# Patient Record
Sex: Female | Born: 1980 | Race: White | Hispanic: No | Smoking: Never smoker
Health system: Southern US, Community
[De-identification: ages and names within clinical notes are randomized; demographics above are authoritative.]

## PROBLEM LIST (undated history)

## (undated) DIAGNOSIS — F609 Personality disorder, unspecified: Secondary | ICD-10-CM

## (undated) DIAGNOSIS — F319 Bipolar disorder, unspecified: Secondary | ICD-10-CM

## (undated) DIAGNOSIS — F419 Anxiety disorder, unspecified: Secondary | ICD-10-CM

## (undated) HISTORY — DX: Bipolar disorder, unspecified: F31.9

## (undated) HISTORY — DX: Anxiety disorder, unspecified: F41.9

## (undated) HISTORY — DX: Personality disorder, unspecified: F60.9

## (undated) HISTORY — PX: COLPOSCOPY W/ BIOPSY / CURETTAGE: SUR283

---

## 2002-08-11 ENCOUNTER — Emergency Department (HOSPITAL_COMMUNITY): Admission: EM | Admit: 2002-08-11 | Discharge: 2002-08-11 | Payer: Self-pay | Admitting: Emergency Medicine

## 2010-10-11 ENCOUNTER — Inpatient Hospital Stay (HOSPITAL_COMMUNITY): Admission: AD | Admit: 2010-10-11 | Payer: Self-pay | Admitting: Obstetrics and Gynecology

## 2011-01-31 ENCOUNTER — Inpatient Hospital Stay (HOSPITAL_COMMUNITY): Admission: AD | Admit: 2011-01-31 | Payer: Self-pay | Source: Ambulatory Visit | Admitting: Obstetrics

## 2011-02-10 ENCOUNTER — Inpatient Hospital Stay (HOSPITAL_COMMUNITY)
Admission: RE | Admit: 2011-02-10 | Discharge: 2011-02-13 | DRG: 765 | Disposition: A | Discharge status: Home / Self Care | Payer: 59 | Source: Ambulatory Visit | Attending: Obstetrics & Gynecology | Admitting: Obstetrics & Gynecology

## 2011-02-10 DIAGNOSIS — O9903 Anemia complicating the puerperium: Secondary | ICD-10-CM | POA: Diagnosis not present

## 2011-02-10 DIAGNOSIS — O480 Post-term pregnancy: Principal | ICD-10-CM | POA: Diagnosis present

## 2011-02-10 DIAGNOSIS — D62 Acute posthemorrhagic anemia: Secondary | ICD-10-CM | POA: Diagnosis not present

## 2011-02-10 LAB — RPR: RPR Ser Ql: NONREACTIVE

## 2011-02-10 LAB — CBC
HCT: 34.5 % — ABNORMAL LOW (ref 36.0–46.0)
Hemoglobin: 11.7 g/dL — ABNORMAL LOW (ref 12.0–15.0)
MCH: 30.4 pg (ref 26.0–34.0)
MCHC: 33.9 g/dL (ref 30.0–36.0)
MCV: 89.6 fL (ref 78.0–100.0)
Platelets: 158 10*3/uL (ref 150–400)
RBC: 3.85 MIL/uL — ABNORMAL LOW (ref 3.87–5.11)
RDW: 12.9 % (ref 11.5–15.5)
WBC: 9.2 10*3/uL (ref 4.0–10.5)

## 2011-02-11 ENCOUNTER — Other Ambulatory Visit: Payer: Self-pay | Admitting: Obstetrics & Gynecology

## 2011-02-12 LAB — CBC
HCT: 25.6 % — ABNORMAL LOW (ref 36.0–46.0)
Hemoglobin: 8.7 g/dL — ABNORMAL LOW (ref 12.0–15.0)
MCH: 30.4 pg (ref 26.0–34.0)
MCHC: 34 g/dL (ref 30.0–36.0)
MCV: 89.5 fL (ref 78.0–100.0)
Platelets: 126 10*3/uL — ABNORMAL LOW (ref 150–400)
RBC: 2.86 MIL/uL — ABNORMAL LOW (ref 3.87–5.11)
RDW: 12.9 % (ref 11.5–15.5)
WBC: 10.4 10*3/uL (ref 4.0–10.5)

## 2011-03-11 NOTE — Discharge Summary (Addendum)
Becky Mata, SIGMAN              ACCOUNT NO.:  [id number]  MEDICAL RECORD NO.:  [id number]           PATIENT TYPE:  I  LOCATION:  9129                          FACILITY:  [facility]  PHYSICIAN:  Genia Del, M.D.DATE OF BIRTH:  Oct 12, 1981  DATE OF ADMISSION:  02/10/2011 DATE OF DISCHARGE:  02/13/2011                              DISCHARGE SUMMARY   ADMISSION DIAGNOSES: 1. Intrauterine pregnancy at 41 weeks and 3 days' gestation with a     favorable Bishop. 2. Admission for induction of labor with low-dose Pitocin.  DISCHARGE DIAGNOSES: 1. Status post cesarean section due to fetal intolerance to labor and     arrest of descent. 2. Delivery of viable female infant. 3. Acute blood loss anemia.  The patient is a G1, P0 at 41 weeks and 3 days' gestation with an [facility] of January 31, 2011.  The patient has received prenatal care at [facility] since 7 weeks' gestation with Marlinda Mike, certified nurse midwife, as primary provider.  PRENATAL LABS:  Rh positive, rubella immune, GBS negative, HIV nonreactive, RPR nonreactive, hepatitis B nonreactive, and GTT within normal limits at 99.  Prenatal course has been uncomplicated.  ALLERGIES:  No known allergies.  CURRENT MEDICATIONS:  Prenatal vitamin 1 tab p.o. daily.  The patient was admitted on Feb 10, 2011.  Admission labs include CBC; WBC 9.2, hemoglobin 11.7, hematocrit 34.5, and platelets 158.  Low-dose Pitocin was started, and the patient did progress in labor with cervical dilatation to 7-8, completely effaced, and +1 station with caput.  Due to arrest of dilatation, decision was made to proceed with C-section.  C- section was performed on Feb 11, 2011, by Dr. Genia Del.  A viable female infant was delivered, weighing 8 pounds and 9 ounces, Apgars were 8 and 9 at 1 and 5 minutes.  The patient was transferred to [facility] in stable condition.  Postoperatively, the patient has progressed well.  Labs on Feb 12, 2011 include CBC; WBC 10.4, hemoglobin 8.7, hematocrit 25.6, and platelets 126.  The patient has progressed well postpartum.  Vital signs were stable.  The patient is tolerating a regular diet and is up ad lib., voiding without problems.  Abdominal incision is low and transverse, closed with staples.  Staples were clean, dry, and intact, and incision is well approximated.  Female infant is successfully breast-feeding.  The patient is to return to the office on day #6, Feb 16, 2011, at 10 a.m. for staple removal.  She has been given the [facility] postpartum booklet.  MEDICATIONS AT DISCHARGE: 1. Ibuprofen 600 mg p.o. q.6 hours p.r.n. pain, dispensed #30, no     refill. 2. Vicodin 1-2 p.o. q.6 hours p.r.n. pain, dispensed #30, no refill. 3. Ferralet 90 one p.o. daily, dispensed #30, refill x1. 4. Colace 100 mg 1-2 p.o. b.i.d. p.r.n. constipation, dispensed #30,     no refill.  The patient has been advised of appointment to return for staple removal and to return for routine postpartum visit in 6 weeks.     Arlana Lindau, NP   ______________________________ Genia Del, M.D.    JF/MEDQ  D:  02/13/2011  T:  02/13/2011  Job:  [id number]  Electronically Signed by Genia Del M.D. on 03/11/2011 05:03:44 PM Electronically Signed by Arlana Lindau  on 03/11/2011 05:04:09 PM

## 2011-03-11 NOTE — Op Note (Signed)
Becky Mata, Becky Mata              ACCOUNT NO.:  [id number]  MEDICAL RECORD NO.:  [id number]           PATIENT TYPE:  LOCATION:                                 FACILITY:  PHYSICIAN:  Genia Del, M.D.DATE OF BIRTH:  1981/02/15  DATE OF PROCEDURE: DATE OF DISCHARGE:                              OPERATIVE REPORT   PREOPERATIVE DIAGNOSIS:  A 41 weeks' gestation, arrest of progression, fetal intolerance to active labor.  POSTOPERATIVE DIAGNOSIS:  A 41 weeks' gestation, arrest of progression, fetal intolerance to active labor.  PROCEDURE:  Urgent primary low transverse cesarean section.  SURGEON:  Genia Del, MD  ASSISTANT:  Becky Organ, MD  ANESTHESIOLOGIST:  Becky Pou, MD  PROCEDURE:  Under epidural, the patient is in 15-degree left decubitus position.  She was prepped with Betadine on the abdominal, suprapubic, vulvar, and vaginal areas and draped as usual.  The Foley is in place in the bladder.  We verified the level of anesthesia, it is adequate.  We infiltrated the subcutaneous tissue with Marcaine 0.25% plain 10 mL.  We made a Pfannenstiel incision with a scalpel, opened the adipose tissue with the scalpel and then the electrocautery.  We opened the aponeurosis transversely with the electrocautery and finished on each side with Mayo scissors.  The recti muscles are separated from the aponeurosis on the midline.  The parietal peritoneum is opened longitudinally.  We put the bladder retractor in place, opened the visceral peritoneum transversely with Metz scissors, reclined the bladder downward, repositioned the bladder retractor.  We made a low transverse hysterotomy with a scalpel. We extended on each side with dressing scissors.  The amniotic fluid is clear.  The fetus is in cephalic presentation.  Birth of a baby girl at 4:11 a.m.  The cord is clamped and cut.  There were two loose nuchal cords.  Apgars are 8 and 9.  The weight is 8 pounds 9  ounces.  We do cord blood donor.  We then removed the placenta.  Pitocin is started in the IV fluids.  The uterus contracted well, uterine revision was done. Both ovaries and both tubes were normal to inspection.  We closed the hysterotomy with a first locked running suture of Vicryl 0, a second plane with a mattress suture is done with Vicryl 0.  Hemostasis was adequate on the hysterotomy.  We then irrigated and suctioned the abdominopelvic cavities.  We completed hemostasis where necessary with the electrocautery on the recti muscles.  We then closed the aponeurosis with two half running sutures of Vicryl 0.  We completed hemostasis on the adipose tissue with the electrocautery.  The separate stitches of plain were done on the adipose tissue, and we reapproximated the skin with staples.  A dry dressing was applied.  The count of instruments and sponges was complete.  The estimated blood loss was 1200 mL.  The ins were 2000 mL, outs 200 mL.  No complications occurred, and the patient was brought to recovery room in good stable status.   Genia Del, M.D.     ML/MEDQ  D:  02/11/2011  T:  02/11/2011  Job:  [id number]  Electronically Signed by Genia Del M.D. on 03/11/2011 05:03:46 PM

## 2011-06-16 ENCOUNTER — Encounter: Payer: Self-pay | Admitting: Internal Medicine

## 2011-06-16 ENCOUNTER — Ambulatory Visit (INDEPENDENT_AMBULATORY_CARE_PROVIDER_SITE_OTHER): Payer: 59 | Admitting: Internal Medicine

## 2011-06-16 DIAGNOSIS — R5383 Other fatigue: Secondary | ICD-10-CM

## 2011-06-16 DIAGNOSIS — M25559 Pain in unspecified hip: Secondary | ICD-10-CM

## 2011-06-16 DIAGNOSIS — R5381 Other malaise: Secondary | ICD-10-CM | POA: Insufficient documentation

## 2011-06-16 NOTE — Assessment & Plan Note (Signed)
Non specific Will check labs Concerning in view of the ongoing joint pains and stiffness

## 2011-06-16 NOTE — Patient Instructions (Signed)
Please set up blood work and bilateral hip x-rays at your convenience CBC with diff, met b, hepatic, TSH, rheumatoid factor, ANA  &sed rate (780.79 and arthralgias)

## 2011-06-16 NOTE — Assessment & Plan Note (Signed)
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

## 2011-06-16 NOTE — Progress Notes (Signed)
  Subjective:    Patient ID: Becky Mata, female    DOB: 1980-11-13, 30 y.o.   MRN: [id number]  HPI Here to establish Having ongoing joint problems Mom just diagnosed with rheumatoid arthritis and is fairly disabled, so she is concerned  Has bilateral hip pain that goes back 5 years Naproxen, tylenol is no help Ibuprofen 800mg  bid will help Has used mom's tramadol 100mg  qAM and this really does help May be worse at times---notes weakness after sex. 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    No Known Allergies  No past medical history on file.  Past Surgical History  Procedure Date  . Cesarean section 2012  . Colposcopy w/ biopsy / curettage ~2009    Family History  Problem Relation Age of Onset  . Arthritis Mother     rheumatoid arthritis  . Alcohol abuse Mother   . Hypertension Father   . Diabetes Maternal Grandmother   . Fibromyalgia Maternal Grandmother   . Heart disease Neg Hx     History   Social History  . Marital Status: Married    Spouse Name: N/A    Number of Children: 1  . Years of Education: N/A   Occupational History  . PT aide [employer]   Social History Main Topics  . Smoking status: Never Smoker   . Smokeless tobacco: Never Used  . Alcohol Use: No  . Drug Use: No  . Sexually Active: Not on file   Other Topics Concern  . Not on file   Social History Narrative  . No narrative on file   Review of Systems No swallowing problems No rashes  No photosensitivity No fevers No problems with weight---back almost to her prepregnancy  weight (has 62 month old) No ulcers    Objective:   Physical Exam  Constitutional: She appears well-developed and well-nourished. No distress.  HENT:  Mouth/Throat: Oropharynx is clear and moist. No oropharyngeal exudate.       No oral lesions  Eyes: Conjunctivae are normal. Pupils are equal, round, and reactive to light.  Neck: Normal range of motion. Neck supple. No thyromegaly present.  Cardiovascular: Normal rate, regular rhythm and normal heart sounds.  Exam reveals no gallop.   No murmur heard. Pulmonary/Chest: Effort normal and breath sounds normal. No respiratory distress. She has no wheezes. She has no rales.  Abdominal: Soft. She exhibits no mass. There is no tenderness.  Musculoskeletal: Normal range of motion. She exhibits no edema and no tenderness.       No active synovitis in any joints Normal ROM in all joints No tenderness in trigger points  Lymphadenopathy:    She has no cervical adenopathy.  Psychiatric: She has a normal mood and affect. Her behavior is normal. Judgment and thought content normal.          Assessment & Plan:

## 2011-06-23 ENCOUNTER — Other Ambulatory Visit (INDEPENDENT_AMBULATORY_CARE_PROVIDER_SITE_OTHER): Payer: 59

## 2011-06-23 ENCOUNTER — Other Ambulatory Visit: Payer: Self-pay | Admitting: *Deleted

## 2011-06-23 ENCOUNTER — Ambulatory Visit (INDEPENDENT_AMBULATORY_CARE_PROVIDER_SITE_OTHER)
Admission: RE | Admit: 2011-06-23 | Discharge: 2011-06-23 | Disposition: A | Discharge status: Home / Self Care | Payer: 59 | Source: Ambulatory Visit | Attending: Internal Medicine | Admitting: Internal Medicine

## 2011-06-23 DIAGNOSIS — M25559 Pain in unspecified hip: Secondary | ICD-10-CM

## 2011-06-23 DIAGNOSIS — R5381 Other malaise: Secondary | ICD-10-CM

## 2011-06-23 DIAGNOSIS — M25551 Pain in right hip: Secondary | ICD-10-CM

## 2011-06-23 DIAGNOSIS — M2550 Pain in unspecified joint: Secondary | ICD-10-CM

## 2011-06-23 DIAGNOSIS — R5383 Other fatigue: Secondary | ICD-10-CM

## 2011-06-23 LAB — CBC WITH DIFFERENTIAL/PLATELET
Basophils Absolute: 0 10*3/uL (ref 0.0–0.1)
Eosinophils Relative: 5.3 % — ABNORMAL HIGH (ref 0.0–5.0)
Monocytes Absolute: 0.4 10*3/uL (ref 0.1–1.0)
Monocytes Relative: 6.2 % (ref 3.0–12.0)
Neutrophils Relative %: 64.4 % (ref 43.0–77.0)
Platelets: 244 10*3/uL (ref 150.0–400.0)
WBC: 5.9 10*3/uL (ref 4.5–10.5)

## 2011-06-23 LAB — SEDIMENTATION RATE: Sed Rate: 12 mm/hr (ref 0–22)

## 2011-06-23 MED ORDER — TRAMADOL HCL 50 MG PO TABS: 50.0000 mg | ORAL_TABLET | Freq: Two times a day (BID) | ORAL | Status: DC | PRN

## 2011-06-23 NOTE — Telephone Encounter (Signed)
Okay to fill tramadol 50mg   1-2 bid prn for severe pain #60 x 0  If she has tried voltaren cream and it helped, that would be good to use It tends to be expensive though Okay to fill #1 bottle x 3 Apply tid prn if she wants to try

## 2011-06-23 NOTE — Telephone Encounter (Signed)
Patient was here today for labs and x-ray and is asking for something for pain, Tramadol or Voltren cream, per patient whichever one you think would work? She would not be using everyday only as needed. Please advise

## 2011-06-23 NOTE — Telephone Encounter (Signed)
rx sent to pharmacy by e-script Left message that rx was sent to pharmacy, also advised pt to call if she wanted to try the voltren cream

## 2011-06-24 LAB — ANA: Anti Nuclear Antibody(ANA): NEGATIVE

## 2011-06-25 LAB — BASIC METABOLIC PANEL
CO2: 20 mEq/L (ref 19–32)
GFR: 80.04 mL/min (ref >60.00)
Glucose, Bld: 81 mg/dL (ref 70–99)
Potassium: 4.6 mEq/L (ref 3.5–5.1)
Sodium: 143 mEq/L (ref 135–145)

## 2011-06-25 LAB — HEPATIC FUNCTION PANEL
ALT: 17 U/L (ref 0–35)
Albumin: 4.2 g/dL (ref 3.5–5.2)
Total Protein: 7 g/dL (ref 6.0–8.3)

## 2011-09-15 ENCOUNTER — Other Ambulatory Visit: Payer: Self-pay | Admitting: Internal Medicine

## 2011-09-15 NOTE — Telephone Encounter (Signed)
rx sent to pharmacy by e-script  

## 2011-09-15 NOTE — Telephone Encounter (Signed)
Ok to fill 

## 2011-09-15 NOTE — Telephone Encounter (Signed)
Okay #60 x 0 

## 2011-12-09 ENCOUNTER — Other Ambulatory Visit: Payer: Self-pay | Admitting: Internal Medicine

## 2011-12-10 NOTE — Telephone Encounter (Signed)
Ok to fill? Last refill 09/15/11

## 2011-12-10 NOTE — Telephone Encounter (Signed)
Okay #60 x 0 She should set up appt within a few months to review her pain issues (coming in with daughter this afternoon?)

## 2011-12-10 NOTE — Telephone Encounter (Signed)
rx sent to pharmacy by e-script Dr.Letvak will discuss with patient

## 2012-02-05 ENCOUNTER — Other Ambulatory Visit: Payer: Self-pay | Admitting: Internal Medicine

## 2012-02-08 NOTE — Telephone Encounter (Signed)
rx sent to pharmacy by e-script .left message to have patient return my call.  

## 2012-02-08 NOTE — Telephone Encounter (Signed)
Okay #60 x 0 Have her set up physical for after Labor day--September or after---since I am refilling the medication periodically

## 2012-03-29 ENCOUNTER — Other Ambulatory Visit: Payer: Self-pay | Admitting: Internal Medicine

## 2012-03-29 NOTE — Telephone Encounter (Signed)
Okay #60 x 0 Have her set up appt for follow up soon

## 2012-03-29 NOTE — Telephone Encounter (Signed)
rx sent to pharmacy by e-script Spoke with patient and advised results, she will schedule f/u.

## 2012-05-12 ENCOUNTER — Encounter: Payer: Self-pay | Admitting: Internal Medicine

## 2012-05-12 ENCOUNTER — Ambulatory Visit (INDEPENDENT_AMBULATORY_CARE_PROVIDER_SITE_OTHER): Payer: 59 | Admitting: Internal Medicine

## 2012-05-12 VITALS — BP 120/86 | HR 93 | Temp 98.2°F | Ht 66.5 in | Wt 133.8 lb

## 2012-05-12 DIAGNOSIS — M25559 Pain in unspecified hip: Secondary | ICD-10-CM

## 2012-05-12 NOTE — Progress Notes (Signed)
  Subjective:    Patient ID: Becky Mata, female    DOB: May 21, 1981, 31 y.o.   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 Helped hips and back---but hurt her shoulders  Uses the tramadol bid three days a week usually Uses aleve regularly Tylenol not really helpful  Current Outpatient Prescriptions on File Prior to Visit  Medication Sig Dispense Refill  . traMADol (ULTRAM) 50 MG tablet TAKE 1 TO 2 TABLETS BY MOUTH TWICE DAILY AS NEEDED  60 tablet  0    No Known Allergies  No past medical history on file.  Past Surgical History  Procedure Date  . Cesarean section 2012  . Colposcopy w/ biopsy / curettage ~2009    Family History  Problem Relation Age of Onset  . Arthritis Mother     rheumatoid arthritis  . Alcohol abuse Mother   . Hypertension Father   . Diabetes Maternal Grandmother   . Fibromyalgia Maternal Grandmother   . Heart disease Neg Hx     History   Social History  . Marital Status: Married    Spouse Name: N/A    Number of Children: 1  . Years of Education: N/A   Occupational History  . PT aide    Social History Main Topics  . Smoking status: Never Smoker   . Smokeless tobacco: Never Used  . Alcohol Use: No  . Drug Use: No  . Sexually Active: Not on file   Other Topics Concern  . Not on file   Social History Narrative  . No narrative on file   Review of Systems Gets tension headaches Appetite is okay    Objective:   Physical Exam  Constitutional: She appears well-developed and well-nourished. No distress.  Musculoskeletal:       Normal ROM in hips---can duck  walk Normal back flexion and no back tenderness  Psychiatric:       Really stressed out Not depressed though          Assessment & Plan:

## 2012-05-12 NOTE — Assessment & Plan Note (Signed)
Some of the pain is related to stressful schedule and travel No time to exercise Continues aleve Averages tramadol 1/day---will continue for now

## 2012-05-18 ENCOUNTER — Other Ambulatory Visit: Payer: Self-pay | Admitting: Internal Medicine

## 2012-06-29 ENCOUNTER — Other Ambulatory Visit: Payer: Self-pay | Admitting: Internal Medicine

## 2012-06-29 NOTE — Telephone Encounter (Signed)
Okay #60 x 0 

## 2012-06-29 NOTE — Telephone Encounter (Signed)
rx sent to pharmacy by e-script  

## 2012-07-01 ENCOUNTER — Ambulatory Visit (INDEPENDENT_AMBULATORY_CARE_PROVIDER_SITE_OTHER): Payer: 59

## 2012-07-01 DIAGNOSIS — Z23 Encounter for immunization: Secondary | ICD-10-CM

## 2012-08-09 ENCOUNTER — Other Ambulatory Visit: Payer: Self-pay | Admitting: Internal Medicine

## 2012-08-09 NOTE — Telephone Encounter (Signed)
Okay #60 x 0 

## 2012-08-09 NOTE — Telephone Encounter (Signed)
rx sent to pharmacy by e-script  

## 2012-09-12 ENCOUNTER — Ambulatory Visit (INDEPENDENT_AMBULATORY_CARE_PROVIDER_SITE_OTHER): Payer: BC Managed Care – PPO | Admitting: Family Medicine

## 2012-09-12 ENCOUNTER — Telehealth: Payer: Self-pay

## 2012-09-12 ENCOUNTER — Encounter: Payer: Self-pay | Admitting: Family Medicine

## 2012-09-12 VITALS — BP 106/66 | HR 84 | Temp 98.1°F | Ht 66.5 in | Wt 132.2 lb

## 2012-09-12 DIAGNOSIS — R300 Dysuria: Secondary | ICD-10-CM

## 2012-09-12 DIAGNOSIS — N390 Urinary tract infection, site not specified: Secondary | ICD-10-CM | POA: Insufficient documentation

## 2012-09-12 LAB — POCT URINALYSIS DIPSTICK
Ketones, UA: NEGATIVE
Protein, UA: NEGATIVE
Spec Grav, UA: 1.01
Urobilinogen, UA: 0.2

## 2012-09-12 LAB — POCT UA - MICROSCOPIC ONLY
Crystals, Ur, HPF, POC: 0
Yeast, UA: 0

## 2012-09-12 MED ORDER — TRAMADOL HCL 50 MG PO TABS: ORAL_TABLET | ORAL | Status: DC

## 2012-09-12 MED ORDER — CIPROFLOXACIN HCL 250 MG PO TABS: 250.0000 mg | ORAL_TABLET | Freq: Two times a day (BID) | ORAL | Status: DC

## 2012-09-12 MED ORDER — PHENAZOPYRIDINE HCL 200 MG PO TABS
200.0000 mg | ORAL_TABLET | Freq: Three times a day (TID) | ORAL | Status: DC | PRN
Start: 2012-09-12 — End: 2013-01-18

## 2012-09-12 NOTE — Telephone Encounter (Signed)
I cannot do anything stronger than tramadol - but if this is for her urinary pain - we could try some pyridium (this helps the burning and bladder pain -- it turns urine orange) Let me know if she wants this

## 2012-09-12 NOTE — Progress Notes (Signed)
  Subjective:    Patient ID: Becky Mata, female    DOB: August 10, 1981, 31 y.o.   MRN: [id number]  HPI Here for uti symptoms  ua is positive today  Started with low back pain - over the holiday  (takes tramadol-was not helping much)  Then started urinary symptoms yesterday- burning to urinate and frequency Small amt of blood in urine Is drinking lots of water  No n/v or fever   Patient Active Problem List  Diagnosis  . Hip pain  . Other malaise and fatigue   No past medical history on file. Past Surgical History  Procedure Date  . Cesarean section 2012  . Colposcopy w/ biopsy / curettage ~2009   History  Substance Use Topics  . Smoking status: Never Smoker   . Smokeless tobacco: Never Used  . Alcohol Use: No   Family History  Problem Relation Age of Onset  . Arthritis Mother     rheumatoid arthritis  . Alcohol abuse Mother   . Hypertension Father   . Diabetes Maternal Grandmother   . Fibromyalgia Maternal Grandmother   . Heart disease Neg Hx    No Known Allergies Current Outpatient Prescriptions on File Prior to Visit  Medication Sig Dispense Refill  . norethindrone-ethinyl estradiol (MICROGESTIN,JUNEL,LOESTRIN) 1-20 MG-MCG tablet Take 1 tablet by mouth daily.      . traMADol (ULTRAM) 50 MG tablet TAKE ONE TO TWO TABLETS BY MOUTH TWICE DAILY AS NEEDED  60 tablet  0      Review of Systems Review of Systems  Constitutional: Negative for fever, appetite change, fatigue and unexpected weight change.  Eyes: Negative for pain and visual disturbance.  Respiratory: Negative for cough and shortness of breath.   Cardiovascular: Negative for cp or palpitations    Gastrointestinal: Negative for nausea, diarrhea and constipation.  Genitourinary: pos for urgency and frequency.  Skin: Negative for pallor or rash   Neurological: Negative for weakness, light-headedness, numbness and headaches.  Hematological: Negative for adenopathy. Does not bruise/bleed easily.    Psychiatric/Behavioral: Negative for dysphoric mood. The patient is not nervous/anxious.         Objective:   Physical Exam  Constitutional: She appears well-developed and well-nourished. No distress.  HENT:  Head: Normocephalic and atraumatic.  Mouth/Throat: Oropharynx is clear and moist.  Eyes: Conjunctivae normal and EOM are normal. Pupils are equal, round, and reactive to light. No scleral icterus.  Neck: Normal range of motion. Neck supple.  Cardiovascular: Normal rate and regular rhythm.   Pulmonary/Chest: Effort normal and breath sounds normal.  Abdominal: Soft. Bowel sounds are normal. She exhibits no distension and no mass. There is tenderness. There is no rebound and no guarding.       Suprapubic tenderness mild  No rebound or guarding  Musculoskeletal: She exhibits no edema.       No cva tenderness Mild R flank / low back tenderness  Neurological: She is alert.  Skin: Skin is warm and dry. No rash noted. No erythema. No pallor.  Psychiatric: She has a normal mood and affect.          Assessment & Plan:

## 2012-09-12 NOTE — Telephone Encounter (Signed)
Sent to her pharmacy-thanks

## 2012-09-12 NOTE — Telephone Encounter (Signed)
Pt seen earlier today for UTI and pt knows Tramadol was sent to pharmacy but pt said she is in a lot of pain and request 1 week of stronger pain med sent to [facility].Please advise.

## 2012-09-12 NOTE — Telephone Encounter (Signed)
Pt notified and said she will try to pyridium, please send to pharm on file

## 2012-09-12 NOTE — Assessment & Plan Note (Signed)
ua pos and urinary symptoms tx with cipro  Enc fluid intake cx urine Update if not starting to improve in several days  or if worsening

## 2012-09-12 NOTE — Patient Instructions (Addendum)
Take cipro as directed and drink lots of water Let us know if no improvement or worse in several days  Do not hold urine too long

## 2012-09-14 LAB — URINE CULTURE

## 2012-10-10 ENCOUNTER — Other Ambulatory Visit: Payer: Self-pay | Admitting: Family Medicine

## 2012-10-10 NOTE — Telephone Encounter (Signed)
rx called into pharmacy

## 2012-10-10 NOTE — Telephone Encounter (Deleted)
Ok to refill 

## 2012-10-10 NOTE — Telephone Encounter (Signed)
Okay #60 x 0 

## 2012-11-15 ENCOUNTER — Other Ambulatory Visit: Payer: Self-pay | Admitting: Internal Medicine

## 2012-11-16 NOTE — Telephone Encounter (Signed)
rx sent to pharmacy by e-script  

## 2012-11-16 NOTE — Telephone Encounter (Signed)
Okay #60 x 0 

## 2012-12-20 ENCOUNTER — Other Ambulatory Visit: Payer: Self-pay | Admitting: Internal Medicine

## 2013-01-05 ENCOUNTER — Telehealth: Payer: Self-pay | Admitting: Internal Medicine

## 2013-01-05 NOTE — Telephone Encounter (Signed)
Please check on her tomorrow 

## 2013-01-05 NOTE — Telephone Encounter (Signed)
Patient Information:  Caller Name: Tangelia  Phone: [phone]  Patient: Becky Mata, Becky Mata  Gender: Female  DOB: [birth year]  Age: 32 Years  PCP: [facility])  Pregnant: No  Office Follow Up:  Does the office need to follow up with this patient?: No  Instructions For The Office: N/A  RN Note:  On BCP; does not have menses. Has moderate headache with sinus congestion and hoarseness.  Moderate sore throat present only at night for past 2 weeks. No white spots in throat. Hydrate and humidify.  Does not want to incur $25.00 copayment for antibiotic. Declined to schedule appointment now; will call scheduler for appointment 01/06/13, if not feeling better.  Symptoms  Reason For Call & Symptoms: Called for antibiotic (Zpack) for "sinus infection" with cold symptoms.  Reports sore throat for 2 weeks, worse at night, hoarsness, thick green nasal discharge only first thing in the morning.  Reviewed Health History In EMR: Yes  Reviewed Medications In EMR: Yes  Reviewed Allergies In EMR: Yes  Reviewed Surgeries / Procedures: Yes  Date of Onset of Symptoms: 12/22/2012  Treatments Tried: otc cold medication, chloreseptic throat spray  Treatments Tried Worked: No OB / GYN:  LMP: Unknown  Guideline(s) Used:  Sinus Pain and Congestion  Disposition Per Guideline:   See Today or Tomorrow in Office  Reason For Disposition Reached:   Sinus congestion (pressure, fullness) present > 10 days  Advice Given:  Reassurance:   Sinus congestion is a normal part of a cold.  Usually home treatment with nasal washes can prevent an actual bacterial sinus infection.  For a Runny Nose With Profuse Discharge:  Nasal mucus and discharge helps to wash viruses and bacteria out of the nose and sinuses.  Blowing the nose is all that is needed.  For a Stuffy Nose - Use Nasal Washes:  Introduction: Saline (salt water) nasal irrigation (nasal wash) is an effective and simple home remedy for  treating stuffy nose and sinus congestion. The nose can be irrigated by pouring, spraying, or squirting salt water into the nose and then letting it run back out.  Medicines for a Stuffy or Runny Nose:  Most cold medicines that are available over-the-counter (OTC) are not helpful.  Antihistamines: Are only helpful if you also have nasal allergies.  If you have a very runny nose and you really think you need a medicine, you can try using a nasal decongestant for a couple days.  Pain and Fever Medicines:  For pain or fever relief, take either acetaminophen or ibuprofen.  They are over-the-counter (OTC) drugs that help treat both fever and pain. You can buy them at the drugstore.  Treat fevers above 101 F (38.3 C). The goal of fever therapy is to bring the fever down to a comfortable level. Remember that fever medicine usually lowers fever 2 degrees F (1 - 1 1/2 degrees C).  Hydration:  Drink plenty of liquids (6-8 glasses of water daily). If the air in your home is dry, use a cool mist humidifier  Call Back If:   Sinus pain lasts longer than 1 day after starting treatment using nasal washes  Sinus congestion (fullness) lasts longer than 10 days  Fever lasts longer than 3 days  You become worse.  Patient Refused Recommendation:  Patient Refused Appt, Patient Requests Appt At Later Date  Will call for appointment, if not better.

## 2013-01-06 NOTE — Telephone Encounter (Signed)
.  left message to have patient return my call, calling to check on patient, advised her to call back

## 2013-01-16 ENCOUNTER — Other Ambulatory Visit: Payer: Self-pay | Admitting: Internal Medicine

## 2013-01-16 NOTE — Telephone Encounter (Signed)
rx sent to pharmacy by e-script  

## 2013-01-16 NOTE — Telephone Encounter (Signed)
Okay #60 x 0 Please have her set up a follow up within the next few months

## 2013-01-18 ENCOUNTER — Ambulatory Visit (INDEPENDENT_AMBULATORY_CARE_PROVIDER_SITE_OTHER): Payer: BC Managed Care – PPO | Admitting: Internal Medicine

## 2013-01-18 ENCOUNTER — Encounter: Payer: Self-pay | Admitting: Internal Medicine

## 2013-01-18 ENCOUNTER — Telehealth: Payer: Self-pay

## 2013-01-18 VITALS — BP 106/66 | HR 86 | Temp 97.6°F | Wt 125.0 lb

## 2013-01-18 DIAGNOSIS — F411 Generalized anxiety disorder: Secondary | ICD-10-CM

## 2013-01-18 DIAGNOSIS — F319 Bipolar disorder, unspecified: Secondary | ICD-10-CM | POA: Insufficient documentation

## 2013-01-18 MED ORDER — CLONAZEPAM 0.5 MG PO TABS: 0.5000 mg | ORAL_TABLET | Freq: Two times a day (BID) | ORAL | Status: DC | PRN

## 2013-01-18 MED ORDER — BUPROPION HCL ER (XL) 150 MG PO TB24: 150.0000 mg | ORAL_TABLET | Freq: Every day | ORAL | Status: DC

## 2013-01-18 NOTE — Assessment & Plan Note (Signed)
Not GAD Not really depressed Had irritable edge that could be bipolar variant--but no true mania benzos have helped a little  P: try low dose bupropion      Clonazepam for rare prn use  25 minute visit--over half in counseling and care planning

## 2013-01-18 NOTE — Telephone Encounter (Signed)
Pt said [facility] did not get escript for Tramadol.Medication phoned to Misty Stanley at [facility] pharmacy as instructed.Pt notified done.

## 2013-01-18 NOTE — Progress Notes (Signed)
  Subjective:    Patient ID: Becky Mata, female    DOB: 1981/01/03, 32 y.o.   MRN: [id number]  HPI Has thought about bringing up this issue with gyn--depression. 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 norethindrone-ethinyl estradiol (MICROGESTIN,JUNEL,LOESTRIN) 1-20 MG-MCG tablet Take 1 tablet by mouth daily.      . traMADol (ULTRAM) 50 MG tablet TAKE 1-2 TABLETS BY MOUTH TWICE DAILY AS NEEDED FOR PAIN  60 tablet  0   No current facility-administered medications on file prior to visit.    No Known Allergies  No past medical history on file.  Past Surgical History  Procedure Laterality Date  . Cesarean section  2012  . Colposcopy w/ biopsy / curettage  ~2009    Family History  Problem Relation Age of Onset  . Arthritis Mother     rheumatoid arthritis  . Alcohol abuse Mother   . Hypertension Father   . Diabetes Maternal Grandmother   . Fibromyalgia Maternal Grandmother   . Heart disease Neg Hx     History    Social History  . Marital Status: Married    Spouse Name: N/A    Number of Children: 1  . Years of Education: N/A   Occupational History  . PT aide [place]   Social History Main Topics  . Smoking status: Never Smoker   . Smokeless tobacco: Never Used  . Alcohol Use: No  . Drug Use: No  . Sexually Active: Not on file   Other Topics Concern  . Not on file   Social History Narrative  . No narrative on file   Review of Systems Has lost some more weight---feels like "stomach tied up in a knot"    Objective:   Physical Exam  Psychiatric:  Mildly pressured Not depressed Slightly labile affect No thought process problems          Assessment & Plan:

## 2013-01-20 ENCOUNTER — Ambulatory Visit: Payer: BC Managed Care – PPO | Admitting: Internal Medicine

## 2013-02-07 NOTE — Telephone Encounter (Signed)
error 

## 2013-02-17 ENCOUNTER — Encounter: Payer: Self-pay | Admitting: Internal Medicine

## 2013-02-17 ENCOUNTER — Ambulatory Visit (INDEPENDENT_AMBULATORY_CARE_PROVIDER_SITE_OTHER): Payer: BC Managed Care – PPO | Admitting: Internal Medicine

## 2013-02-17 VITALS — BP 110/80 | HR 108 | Temp 97.8°F | Wt 120.0 lb

## 2013-02-17 DIAGNOSIS — F411 Generalized anxiety disorder: Secondary | ICD-10-CM

## 2013-02-17 MED ORDER — CLONAZEPAM 0.5 MG PO TABS: 0.5000 mg | ORAL_TABLET | Freq: Two times a day (BID) | ORAL | Status: DC | PRN

## 2013-02-17 MED ORDER — TRAMADOL HCL 50 MG PO TABS: 50.0000 mg | ORAL_TABLET | Freq: Two times a day (BID) | ORAL | Status: DC | PRN

## 2013-02-17 MED ORDER — BUPROPION HCL ER (XL) 300 MG PO TB24: 300.0000 mg | ORAL_TABLET | Freq: Every day | ORAL | Status: DC

## 2013-02-17 NOTE — Progress Notes (Signed)
  Subjective:    Patient ID: Becky Mata, female    DOB: 1981-08-03, 32 y.o.   MRN: [id number]  HPI Noticed an improvement when she started the med Lasted about 10 days and then it seemed to wane Was not getting as angry anymore Initial "nervous feeling" in stomach  Has tried to work out things with husband Trying to have time alone in evening She wanted to go out to eat occasionally---hasn't happened Ongoing stress  Current Outpatient Prescriptions on File Prior to Visit  Medication Sig Dispense Refill  . buPROPion (WELLBUTRIN XL) 150 MG 24 hr tablet Take 1 tablet (150 mg total) by mouth daily.  30 tablet  1  . clonazePAM (KLONOPIN) 0.5 MG tablet Take 1 tablet (0.5 mg total) by mouth 2 (two) times daily as needed for anxiety.  30 tablet  0  . norethindrone-ethinyl estradiol (MICROGESTIN,JUNEL,LOESTRIN) 1-20 MG-MCG tablet Take 1 tablet by mouth daily.      . traMADol (ULTRAM) 50 MG tablet TAKE 1-2 TABLETS BY MOUTH TWICE DAILY AS NEEDED FOR PAIN  60 tablet  0   No current facility-administered medications on file prior to visit.    No Known Allergies  No past medical history on file.  Past Surgical History  Procedure Laterality Date  . Cesarean section  2012  . Colposcopy w/ biopsy / curettage  ~2009    Family History  Problem Relation Age of Onset  . Arthritis Mother     rheumatoid arthritis  . Alcohol abuse Mother   . Hypertension Father   . Diabetes Maternal Grandmother   . Fibromyalgia Maternal Grandmother   . Heart disease Neg Hx     History   Social History  . Marital Status: Married    Spouse Name: N/A    Number of Children: 1  . Years of Education: N/A   Occupational History  . PT aide [employer]   Social History Main Topics  . Smoking status: Never Smoker   . Smokeless tobacco: Never Used  . Alcohol Use: No  . Drug Use: No  . Sexually Active: Not on file   Other Topics Concern  . Not on file   Social History Narrative  . No  narrative on file   Review of Systems Appetite seems okay--was a little better at first Lost a few pounds though    Objective:   Physical Exam  Constitutional: She appears well-developed and well-nourished. No distress.  Psychiatric: She has a normal mood and affect. Her behavior is normal. Thought content normal.          Assessment & Plan:

## 2013-02-17 NOTE — Assessment & Plan Note (Signed)
Did notice an improvement at first, but that faded No major side effects  Will go ahead and increase to 300mg  daily Discussed expectations

## 2013-03-21 ENCOUNTER — Other Ambulatory Visit: Payer: Self-pay | Admitting: Internal Medicine

## 2013-03-22 NOTE — Telephone Encounter (Signed)
rx called into pharmacy

## 2013-03-22 NOTE — Telephone Encounter (Signed)
Okay #30 x 0 Due for follow up in August ---she should schedule

## 2013-04-03 ENCOUNTER — Other Ambulatory Visit: Payer: Self-pay | Admitting: Internal Medicine

## 2013-04-04 NOTE — Telephone Encounter (Signed)
rx sent to pharmacy by e-script  

## 2013-04-04 NOTE — Telephone Encounter (Signed)
Okay #60 x 0 

## 2013-05-03 ENCOUNTER — Other Ambulatory Visit: Payer: Self-pay | Admitting: Internal Medicine

## 2013-05-03 NOTE — Telephone Encounter (Signed)
Okay #30 x 0 

## 2013-05-03 NOTE — Telephone Encounter (Signed)
rx called into pharmacy

## 2013-05-09 ENCOUNTER — Other Ambulatory Visit: Payer: Self-pay | Admitting: Internal Medicine

## 2013-05-10 NOTE — Telephone Encounter (Signed)
Okay #60 x 0 

## 2013-05-10 NOTE — Telephone Encounter (Signed)
rx called into pharmacy

## 2013-05-25 ENCOUNTER — Encounter: Payer: Self-pay | Admitting: Radiology

## 2013-05-26 ENCOUNTER — Ambulatory Visit: Payer: BC Managed Care – PPO | Admitting: Internal Medicine

## 2013-05-26 DIAGNOSIS — Z0289 Encounter for other administrative examinations: Secondary | ICD-10-CM

## 2013-06-01 ENCOUNTER — Encounter: Payer: Self-pay | Admitting: Radiology

## 2013-06-02 ENCOUNTER — Ambulatory Visit (INDEPENDENT_AMBULATORY_CARE_PROVIDER_SITE_OTHER): Payer: BC Managed Care – PPO | Admitting: Internal Medicine

## 2013-06-02 ENCOUNTER — Encounter: Payer: Self-pay | Admitting: Internal Medicine

## 2013-06-02 VITALS — BP 100/68 | HR 86 | Temp 98.6°F | Wt 123.0 lb

## 2013-06-02 DIAGNOSIS — F411 Generalized anxiety disorder: Secondary | ICD-10-CM

## 2013-06-02 MED ORDER — ALPRAZOLAM 0.25 MG PO TABS: 0.2500 mg | ORAL_TABLET | Freq: Three times a day (TID) | ORAL | Status: DC | PRN

## 2013-06-02 NOTE — Progress Notes (Signed)
  Subjective:    Patient ID: Becky Mata, female    DOB: [birth year], 32 y.o.   MRN: [id number]  HPI Doing "okay" Daughter in day care full time Now will have 1 day a week to herself  Took a while for stomach to get used to the higher dose of the bupropion Has to take it first thing in AM Has noticed a "mood change" if she is late on the dose Dose seem to help her when she takes it in the morning  Still notices hormonal component Mood swings are greater in second week of her OCP Some crying spells at times---gets "overwhelmed" feeling  Still doesn't feel fulfilled in marriage "everyone gets what they need but not me" Have been getting along better  Current Outpatient Prescriptions on File Prior to Visit  Medication Sig Dispense Refill  . buPROPion (WELLBUTRIN XL) 300 MG 24 hr tablet Take 1 tablet (300 mg total) by mouth daily.  30 tablet  3  . clonazePAM (KLONOPIN) 0.5 MG tablet TAKE 1 TABLET BY MOUTH TWICE DAILY AS NEEDED FOR ANXIETY  30 tablet  0  . traMADol (ULTRAM) 50 MG tablet TAKE 1 TABLET BY MOUTH TWICE DAILY AS NEEDED FOR PAIN  60 tablet  0   No current facility-administered medications on file prior to visit.    No Known Allergies  No past medical history on file.  Past Surgical History  Procedure Laterality Date  . Cesarean section  2012  . Colposcopy w/ biopsy / curettage  ~2009    Family History  Problem Relation Age of Onset  . Arthritis Mother     rheumatoid arthritis  . Alcohol abuse Mother   . Hypertension Father   . Diabetes Maternal Grandmother   . Fibromyalgia Maternal Grandmother   . Heart disease Neg Hx     History   Social History  . Marital Status: Married    Spouse Name: N/A    Number of Children: 1  . Years of Education: N/A   Occupational History  . PT aide [employer]   Social History Main Topics  . Smoking status: Never Smoker   . Smokeless tobacco: Never Used  . Alcohol Use: No  . Drug Use: No  . Sexual Activity:  Not on file   Other Topics Concern  . Not on file   Social History Narrative  . No narrative on file   Review of Systems Appetite is not very good Clonazepam is not really helping---takes too long to help. Does help her appetite some. Weight is up 3# Sleeping well     Objective:   Physical Exam  Constitutional: She appears well-developed and well-nourished. No distress.  Psychiatric:  Smiling and looks happier Almost slightly pressured speech appropriate affect          Assessment & Plan:

## 2013-06-02 NOTE — Assessment & Plan Note (Signed)
Better on the bupropion--getting used to this dose Discussed considering the 150mg  bid (24 hour dose) if she has withdrawal with late dose Almost slight hypomania Still with mood swings  Clonazepam not really helping Will change to alprazolam for prn use

## 2013-06-09 ENCOUNTER — Other Ambulatory Visit: Payer: BC Managed Care – PPO

## 2013-07-20 ENCOUNTER — Other Ambulatory Visit: Payer: Self-pay | Admitting: Internal Medicine

## 2013-07-20 NOTE — Telephone Encounter (Signed)
Okay bupropion for a year Tramadol #60 x 0

## 2013-07-21 ENCOUNTER — Ambulatory Visit (INDEPENDENT_AMBULATORY_CARE_PROVIDER_SITE_OTHER): Payer: BC Managed Care – PPO

## 2013-07-21 DIAGNOSIS — Z23 Encounter for immunization: Secondary | ICD-10-CM

## 2013-07-21 NOTE — Telephone Encounter (Signed)
Tramadol phoned in to pharmacy. 

## 2013-08-02 ENCOUNTER — Other Ambulatory Visit: Payer: Self-pay | Admitting: Internal Medicine

## 2013-08-02 NOTE — Telephone Encounter (Signed)
Last filled 06/02/13 

## 2013-08-02 NOTE — Telephone Encounter (Signed)
Okay #60 x 0 

## 2013-08-02 NOTE — Telephone Encounter (Signed)
Have tried call [facility] multiple times, even called different locations and the phone hangs up, will try again later.

## 2013-08-03 NOTE — Telephone Encounter (Signed)
rx called into pharmacy

## 2013-08-08 ENCOUNTER — Encounter: Payer: Self-pay | Admitting: Internal Medicine

## 2013-08-08 ENCOUNTER — Other Ambulatory Visit: Payer: Self-pay | Admitting: Internal Medicine

## 2013-08-09 NOTE — Telephone Encounter (Signed)
[facility] called for alprazolam refill; did not get refill authorization on 08/03/13. Medication phoned to [facility] pharmacy as instructed.

## 2013-08-09 NOTE — Telephone Encounter (Signed)
Phoned in 08/02/13

## 2013-08-14 ENCOUNTER — Encounter: Payer: Self-pay | Admitting: Family Medicine

## 2013-08-14 ENCOUNTER — Ambulatory Visit (INDEPENDENT_AMBULATORY_CARE_PROVIDER_SITE_OTHER): Payer: BC Managed Care – PPO | Admitting: Family Medicine

## 2013-08-14 VITALS — BP 108/60 | HR 84 | Temp 97.5°F | Wt 129.5 lb

## 2013-08-14 DIAGNOSIS — R300 Dysuria: Secondary | ICD-10-CM

## 2013-08-14 DIAGNOSIS — N390 Urinary tract infection, site not specified: Secondary | ICD-10-CM

## 2013-08-14 LAB — POCT URINALYSIS DIPSTICK
Glucose, UA: NEGATIVE
Nitrite, UA: NEGATIVE
Spec Grav, UA: 1.015
Urobilinogen, UA: NEGATIVE

## 2013-08-14 MED ORDER — SULFAMETHOXAZOLE-TMP DS 800-160 MG PO TABS: 1.0000 | ORAL_TABLET | Freq: Two times a day (BID) | ORAL | Status: DC

## 2013-08-14 NOTE — Assessment & Plan Note (Signed)
Presumed, improved now, hold septra, check ucx.  Start abx if sx increase, return.  She agrees.  She may have cleared it with inc in PO fluids.

## 2013-08-14 NOTE — Patient Instructions (Signed)
Drink plenty of water and hold the antibiotics for now; start them if the symptoms increase.  We'll contact you with your lab report.  Take care.

## 2013-08-14 NOTE — Progress Notes (Signed)
Dysuria: urgency and pain with urination duration of symptoms: ~2 day abdominal pain: mild lower abd pain Fevers: no back pain: no  Vomiting:no She had some pyridium left over and that helped.  Drinking more water in the meantime.   She feels better today, only with some left over urgency.    Meds, vitals, and allergies reviewed.   ROS: See HPI.  Otherwise negative.    GEN: nad, alert and oriented HEENT: mucous membranes moist NECK: supple CV: rrr.  PULM: ctab, no inc wob ABD: soft, +bs, suprapubic area not tender EXT: no edema SKIN: no acute rash BACK: no CVA pain

## 2013-08-15 LAB — URINE CULTURE
Colony Count: NO GROWTH
Organism ID, Bacteria: NO GROWTH

## 2013-10-16 ENCOUNTER — Other Ambulatory Visit: Payer: Self-pay | Admitting: Internal Medicine

## 2013-10-16 NOTE — Telephone Encounter (Signed)
XANAX last filled 08/02/13 TRAMADOL 07/20/13

## 2013-10-16 NOTE — Telephone Encounter (Signed)
Okay #60 x 0 for each 

## 2013-10-16 NOTE — Telephone Encounter (Signed)
rx called into pharmacy

## 2013-11-10 LAB — HM PAP SMEAR: HM PAP: NEGATIVE

## 2013-12-11 ENCOUNTER — Other Ambulatory Visit: Payer: Self-pay | Admitting: Internal Medicine

## 2013-12-11 NOTE — Telephone Encounter (Signed)
Okay #60 x 0 for each Have her set up follow up in the next 2-3 months

## 2013-12-11 NOTE — Telephone Encounter (Signed)
rx called into pharmacy

## 2013-12-11 NOTE — Telephone Encounter (Signed)
Both last filled 10/16/13

## 2014-02-07 ENCOUNTER — Other Ambulatory Visit: Payer: Self-pay | Admitting: Internal Medicine

## 2014-02-08 NOTE — Telephone Encounter (Signed)
Okay #60 x 0 Have her set up follow up within a few months

## 2014-02-08 NOTE — Telephone Encounter (Signed)
rx called into pharmacy

## 2014-02-26 ENCOUNTER — Other Ambulatory Visit: Payer: Self-pay | Admitting: Internal Medicine

## 2014-02-27 NOTE — Telephone Encounter (Signed)
rx called into pharmacy

## 2014-02-27 NOTE — Telephone Encounter (Signed)
Okay for #60 x 0 Have her set up for physical in 3-6 months

## 2014-03-30 ENCOUNTER — Encounter: Payer: Self-pay | Admitting: Internal Medicine

## 2014-03-30 ENCOUNTER — Ambulatory Visit (INDEPENDENT_AMBULATORY_CARE_PROVIDER_SITE_OTHER): Payer: BC Managed Care – PPO | Admitting: Internal Medicine

## 2014-03-30 VITALS — BP 104/66 | HR 96 | Temp 97.9°F | Wt 125.0 lb

## 2014-03-30 DIAGNOSIS — N390 Urinary tract infection, site not specified: Secondary | ICD-10-CM

## 2014-03-30 DIAGNOSIS — R300 Dysuria: Secondary | ICD-10-CM

## 2014-03-30 DIAGNOSIS — N83209 Unspecified ovarian cyst, unspecified side: Secondary | ICD-10-CM

## 2014-03-30 DIAGNOSIS — R319 Hematuria, unspecified: Secondary | ICD-10-CM

## 2014-03-30 LAB — POCT URINALYSIS DIPSTICK
Glucose, UA: NEGATIVE
Ketones, UA: NEGATIVE
Nitrite, UA: POSITIVE
PH UA: 7
Protein, UA: NEGATIVE
Spec Grav, UA: 1.01
UROBILINOGEN UA: 0.2

## 2014-03-30 MED ORDER — PHENAZOPYRIDINE HCL 100 MG PO TABS: 100.0000 mg | ORAL_TABLET | Freq: Three times a day (TID) | ORAL | Status: DC | PRN

## 2014-03-30 MED ORDER — CIPROFLOXACIN HCL 500 MG PO TABS
500.0000 mg | ORAL_TABLET | Freq: Two times a day (BID) | ORAL | Status: DC
Start: 2014-03-30 — End: 2014-07-05

## 2014-03-30 MED ORDER — TRAMADOL HCL 50 MG PO TABS: ORAL_TABLET | ORAL | Status: DC

## 2014-03-30 NOTE — Patient Instructions (Addendum)

## 2014-03-30 NOTE — Progress Notes (Signed)
HPI  Pt presents to the clinic today with c/o dysuria and blood in her urine. She reports this started 1 week ago. She denies fever, chills, nausea or low back pain. She did try some pyridium OTC with some relief.  Additionally, she needs a refill of her tramadol today. It helps with the pain associated with her ovarian cyst.  Review of Systems  No past medical history on file.  Family History  Problem Relation Age of Onset  . Arthritis Mother     rheumatoid arthritis  . Alcohol abuse Mother   . Hypertension Father   . Diabetes Maternal Grandmother   . Fibromyalgia Maternal Grandmother   . Heart disease Neg Hx     History   Social History  . Marital Status: Married    Spouse Name: N/A    Number of Children: 1  . Years of Education: N/A   Occupational History  . PT aide [place]   Social History Main Topics  . Smoking status: Never Smoker   . Smokeless tobacco: Never Used  . Alcohol Use: No  . Drug Use: No  . Sexual Activity: Not on file   Other Topics Concern  . Not on file   Social History Narrative  . No narrative on file    No Known Allergies  Constitutional: Denies fever, malaise, fatigue, headache or abrupt weight changes.   GU: Pt reports urgency, frequency and pain with urination. Denies burning sensation, odor or discharge. Skin: Denies redness, rashes, lesions or ulcercations.   No other specific complaints in a complete review of systems (except as listed in HPI above).    Objective:   Physical Exam  BP 104/66  Pulse 96  Temp(Src) 97.9 F (36.6 C) (Oral)  Wt 125 lb (56.7 kg)  SpO2 98%  Wt Readings from Last 3 Encounters:  03/30/14 125 lb (56.7 kg)  08/14/13 129 lb 8 oz (58.741 kg)  06/02/13 123 lb (55.792 kg)    General: Appears her stated age, well developed, well nourished in NAD. Cardiovascular: Normal rate and rhythm. S1,S2 noted.  No murmur, rubs or gallops noted. No JVD or BLE edema. No carotid bruits noted. Pulmonary/Chest:  Normal effort and positive vesicular breath sounds. No respiratory distress. No wheezes, rales or ronchi noted.  Abdomen: Soft and nontender. Normal bowel sounds, no bruits noted. No distention or masses noted. Liver, spleen and kidneys non palpable. Tender to palpation over the bladder area. No CVA tenderness.      Assessment & Plan:   Urgency, Frequency, Dysuria secondary to UTI  Urinalysis: mod leuks, mod blood, pos nitrites Will send for urine culture eRx sent if for Cipro 500 mg BID x 5 days OK to take AZO OTC Drink plenty of fluids  Ovarian cyst:  Tramadol refilled RTC as needed or if symptoms persist.

## 2014-03-30 NOTE — Progress Notes (Signed)
Pre visit review using our clinic review tool, if applicable. No additional management support is needed unless otherwise documented below in the visit note. 

## 2014-03-30 NOTE — Addendum Note (Signed)
Addended by: Roena MaladyEVONTENNO, MELANIE Y on: 03/30/2014 02:48 PM   Modules accepted: Orders

## 2014-03-30 NOTE — Addendum Note (Signed)
Addended by: Lorre MunroeBAITY, REGINA W on: 03/30/2014 02:36 PM   Modules accepted: Orders

## 2014-04-03 LAB — URINE CULTURE: Colony Count: >=100000

## 2014-04-05 ENCOUNTER — Telehealth: Payer: Self-pay | Admitting: Internal Medicine

## 2014-04-05 NOTE — Telephone Encounter (Signed)
Left message to have pt call for an appt and to let me know how she's doing

## 2014-04-05 NOTE — Telephone Encounter (Signed)
Check on her tomorrow morning Can offer same day if rash not better I will not be adding on in afternoon but I could extend morning if needed

## 2014-04-05 NOTE — Telephone Encounter (Signed)
Patient Information:  Caller Name: Mardella LaymanLindsey  Phone: [phone]  Patient: Becky PeakSpivey, Kennesha N  Gender: Female  DOB: May 12, 1981  Age: [age] Years  PCP: Tillman AbideLetvak , [facility](Family Practice)  Pregnant: No  Office Follow Up:  Does the office need to follow up with this patient?: No  Instructions For The Office: N/A  RN Note:  Care Advice per Guidelines. Call back parameters stated.  Symptoms  Reason For Call & Symptoms: Onset of rash this am at 08:00. No itching. Only on her back. Took shirt and bra off. Did not see any insect. States there is a stinging pain when it first comes up. All lesions are the same size and pink sl.swollen areas.  Reviewed Health History In EMR: Yes  Reviewed Medications In EMR: Yes  Reviewed Allergies In EMR: Yes  Reviewed Surgeries / Procedures: Yes  Date of Onset of Symptoms: 04/05/2014  Treatments Tried: Benadryl cream  Treatments Tried Worked: Yes OB / GYN:  LMP: Unknown  Guideline(s) Used:  Rash or Redness - Localized  Disposition Per Guideline:   Home Care  Reason For Disposition Reached:   Mild localized rash  Advice Given:  Avoid the Cause:   Try to find the cause. Consider irritants like a plant (e.g., poison ivy or evergreens), chemicals (e.g., solvents or insecticides), fiberglass, a new cosmetic, or new jewelry (called contact dermatitis). A pet may be carrying the irritating substance (e.g., with poison ivy or poison oak).  Avoid Soap:  Wash the area once thoroughly with soap to remove any remaining irritants. Thereafter avoid soaps to this area. Cleanse the area when needed with warm water.  Apply Cold to the Area:  Apply or soak in cold water for 20 minutes every 3 to 4 hours to reduce itching or pain.  Hydrocortisone Cream for Itching:   Keep the cream in the refrigerator (Reason: it feels better if applied cold).  Available over-the-counter in [place] as 0.5% and 1% cream.  Avoid Scratching:  Try not to scratch. Cut your  fingernails short.  Contagiousness:  Adults with localized rashes do not need to miss any work or school.  Expected Course:  Most of these rashes pass in 2 to 3 days.  Call Back If:  Rash spreads or becomes worse  Rash lasts longer than 1 week  You become worse.  Patient Will Follow Care Advice:  YES

## 2014-04-10 ENCOUNTER — Other Ambulatory Visit: Payer: Self-pay | Admitting: Internal Medicine

## 2014-04-10 NOTE — Telephone Encounter (Signed)
02/08/14 

## 2014-04-10 NOTE — Telephone Encounter (Signed)
rx called into pharmacy

## 2014-04-10 NOTE — Telephone Encounter (Signed)
Okay #60 x 0 She still needs to set up an appt with me to review this med use

## 2014-06-04 ENCOUNTER — Other Ambulatory Visit: Payer: Self-pay | Admitting: Internal Medicine

## 2014-06-04 NOTE — Telephone Encounter (Signed)
04/10/14 

## 2014-06-04 NOTE — Telephone Encounter (Signed)
Okay #60 x 0 Have her set up a physical within the next 3-4 months

## 2014-06-04 NOTE — Telephone Encounter (Signed)
Last OV and filled 03/30/2014--please advise

## 2014-06-05 NOTE — Telephone Encounter (Signed)
rx called into pharmacy Spoke with patient and advised results, she will call back

## 2014-06-05 NOTE — Telephone Encounter (Signed)
rx called into pharmacy

## 2014-06-05 NOTE — Telephone Encounter (Signed)
Okay #60 x 0 She still needs to set up OV with me in next few months to review

## 2014-06-05 NOTE — Telephone Encounter (Signed)
Dr. L pt. 

## 2014-07-05 ENCOUNTER — Ambulatory Visit (INDEPENDENT_AMBULATORY_CARE_PROVIDER_SITE_OTHER): Payer: BC Managed Care – PPO | Admitting: Internal Medicine

## 2014-07-05 ENCOUNTER — Encounter: Payer: Self-pay | Admitting: Internal Medicine

## 2014-07-05 VITALS — BP 104/76 | HR 76 | Temp 98.2°F | Wt 128.0 lb

## 2014-07-05 DIAGNOSIS — N3090 Cystitis, unspecified without hematuria: Secondary | ICD-10-CM | POA: Insufficient documentation

## 2014-07-05 DIAGNOSIS — R300 Dysuria: Secondary | ICD-10-CM

## 2014-07-05 DIAGNOSIS — F411 Generalized anxiety disorder: Secondary | ICD-10-CM

## 2014-07-05 LAB — POCT URINALYSIS DIPSTICK
Bilirubin, UA: NEGATIVE
Glucose, UA: NEGATIVE
Ketones, UA: NEGATIVE
Leukocytes, UA: NEGATIVE
Nitrite, UA: NEGATIVE
PROTEIN UA: NEGATIVE
SPEC GRAV UA: 1.025
UROBILINOGEN UA: NEGATIVE
pH, UA: 6.5

## 2014-07-05 MED ORDER — CLONAZEPAM 0.5 MG PO TABS: 0.5000 mg | ORAL_TABLET | Freq: Two times a day (BID) | ORAL | Status: DC | PRN

## 2014-07-05 MED ORDER — SULFAMETHOXAZOLE-TMP DS 800-160 MG PO TABS: 1.0000 | ORAL_TABLET | Freq: Two times a day (BID) | ORAL | Status: DC

## 2014-07-05 NOTE — Progress Notes (Signed)
Pre visit review using our clinic review tool, if applicable. No additional management support is needed unless otherwise documented below in the visit note. 

## 2014-07-05 NOTE — Assessment & Plan Note (Signed)
I suspect this is post coital Will treat for 3 days--then after sex

## 2014-07-05 NOTE — Assessment & Plan Note (Signed)
Mostly reactive Doing poorly with her marriage but still hopes it can be saved Alprazolam not helping---will switch back to clonazepam Continue the bupropion Counseling over half of 25 minute visit

## 2014-07-05 NOTE — Progress Notes (Signed)
   Subjective:    Patient ID: Becky Mata, female    DOB: 1981/07/29, 33 y.o.   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--- no place to void Symptoms started again almost a week ago---some blood Yesterday she felt it in her back Symptoms better with increased fluids No fever  Doesn't really have menstrual cycles on her OCP Ongoing marital problems but still together Urinary symptoms seem to be post-coital (though she is not concerned)  Still lots of stress and anxiety Has to "walk on eggshells in the household not knowing what to say" Stress with 35 year old He isn't happy and that is a big part of the problem Has had to "x out" the drama from her family--they were lots of drama (but she also lacks support outside of her difficult marriage)  Alprazolam not much help--just makes her sleepy  Not clearly depressed  Current Outpatient Prescriptions on File Prior to Visit  Medication Sig Dispense Refill  . ALPRAZolam (XANAX) 0.25 MG tablet TAKE 1 TABLET BY MOUTH THREE TIMES DAILY AS NEEDED FOR SLEEP  60 tablet  0  . buPROPion (WELLBUTRIN XL) 300 MG 24 hr tablet TAKE 1 TABLET BY MOUTH EVERY DAY  30 tablet  11  . MICROGESTIN FE 1/20 1-20 MG-MCG tablet Take 1 tablet by mouth daily.       . traMADol (ULTRAM) 50 MG tablet TAKE 1 TABLET BY MOUTH TWICE DAILY AS NEEDED PAIN  60 tablet  0   No current facility-administered medications on file prior to visit.    No Known Allergies  No past medical history on file.  Past Surgical History  Procedure Laterality Date  . Cesarean section  2012  . Colposcopy w/ biopsy / curettage  ~2009    Family History  Problem Relation Age of Onset  . Arthritis Mother     rheumatoid arthritis  . Alcohol abuse Mother   . Hypertension Father   . Diabetes Maternal Grandmother   . Fibromyalgia Maternal Grandmother   . Heart disease Neg Hx     History   Social History  . Marital Status: Married   Spouse Name: N/A    Number of Children: 1  . Years of Education: N/A   Occupational History  . PT aide [place]   Social History Main Topics  . Smoking status: Never Smoker   . Smokeless tobacco: Never Used  . Alcohol Use: No  . Drug Use: No  . Sexual Activity: Not on file   Other Topics Concern  . Not on file   Social History Narrative  . No narrative on file   Review of Systems No nausea or vomiting (other than after taking pyridium) Not sleeping great--some troubling dreams (especially after the last 2 fights with her husband) Appetite is still not good--- weight up some. Can eat better if she takes the alprazolam Will occasionally use the tramadol for pain    Objective:   Physical Exam  Constitutional: She appears well-developed and well-nourished. No distress.  Abdominal: Soft. She exhibits no distension. There is no tenderness. There is no rebound and no guarding.  Musculoskeletal:  No CVA tenderness  Psychiatric:  Anxious and somewhat pressured speech Not clearly depressed Normal appearance          Assessment & Plan:

## 2014-08-13 ENCOUNTER — Other Ambulatory Visit: Payer: Self-pay | Admitting: Internal Medicine

## 2014-08-13 NOTE — Telephone Encounter (Signed)
06/05/14 

## 2014-08-14 ENCOUNTER — Other Ambulatory Visit: Payer: Self-pay | Admitting: Internal Medicine

## 2014-08-14 NOTE — Telephone Encounter (Signed)
rx called into pharmacy

## 2014-08-14 NOTE — Telephone Encounter (Signed)
Okay #60 x 0 

## 2014-09-13 ENCOUNTER — Other Ambulatory Visit: Payer: Self-pay | Admitting: Internal Medicine

## 2014-09-13 NOTE — Telephone Encounter (Signed)
Clonazepam 07/05/14 Tramadol 08/14/14

## 2014-09-13 NOTE — Telephone Encounter (Signed)
rx called into pharmacy

## 2014-09-13 NOTE — Telephone Encounter (Signed)
Approved: #60 x 0 for each 

## 2014-10-01 ENCOUNTER — Other Ambulatory Visit: Payer: Self-pay | Admitting: Internal Medicine

## 2014-12-03 ENCOUNTER — Other Ambulatory Visit: Payer: Self-pay | Admitting: Internal Medicine

## 2014-12-03 NOTE — Telephone Encounter (Signed)
Received refill request electronically from pharmacy. Last refill Clonazepam 09/13/14 #60, last refill Tramadol #60 same date. Last office visit 07/05/14. Is it okay to refill mediation?

## 2014-12-04 NOTE — Telephone Encounter (Signed)
Approved: okay #60 x 0 for each

## 2014-12-04 NOTE — Telephone Encounter (Signed)
Refills called to pharmacy as instructed. 

## 2015-01-14 ENCOUNTER — Other Ambulatory Visit: Payer: Self-pay | Admitting: Internal Medicine

## 2015-01-14 NOTE — Telephone Encounter (Signed)
Ok to fill 

## 2015-01-14 NOTE — Telephone Encounter (Signed)
Becky Mata at pharmacy request a refill on Microgestin also. Becky Mata stated that they tried to send it thru the system and it would not go thru.

## 2015-01-14 NOTE — Telephone Encounter (Signed)
Approved:1 year of the birth control Nystatin okay x 1 refill

## 2015-01-15 MED ORDER — NORETHIN ACE-ETH ESTRAD-FE 1-20 MG-MCG PO TABS: 1.0000 | ORAL_TABLET | Freq: Every day | ORAL | Status: DC

## 2015-01-15 NOTE — Telephone Encounter (Signed)
rx sent to pharmacy by e-script  

## 2015-02-08 ENCOUNTER — Other Ambulatory Visit: Payer: Self-pay | Admitting: Internal Medicine

## 2015-02-08 NOTE — Telephone Encounter (Signed)
rx called into pharmacy

## 2015-02-08 NOTE — Telephone Encounter (Signed)
12/04/2014 

## 2015-02-08 NOTE — Telephone Encounter (Signed)
Approved: #60 x 0 for each 

## 2015-04-25 ENCOUNTER — Other Ambulatory Visit: Payer: Self-pay | Admitting: Internal Medicine

## 2015-04-26 NOTE — Telephone Encounter (Signed)
Refilll request. Both Rx last prescribed on 02/08/15.

## 2015-04-26 NOTE — Telephone Encounter (Signed)
[facility]  Notified of patient, Dr Karle StarchLetvak's comments

## 2015-04-26 NOTE — Telephone Encounter (Signed)
Approved:okay #60 x 0 for each She needs to schedule a physical soon---or I will not be able to keep refilling these prescriptions

## 2015-06-25 ENCOUNTER — Emergency Department
Admission: EM | Admit: 2015-06-25 | Discharge: 2015-06-25 | Disposition: A | Discharge status: Home / Self Care | Payer: PRIVATE HEALTH INSURANCE | Attending: Emergency Medicine | Admitting: Emergency Medicine

## 2015-06-25 ENCOUNTER — Emergency Department: Payer: PRIVATE HEALTH INSURANCE

## 2015-06-25 ENCOUNTER — Encounter: Payer: Self-pay | Admitting: Emergency Medicine

## 2015-06-25 DIAGNOSIS — Z792 Long term (current) use of antibiotics: Secondary | ICD-10-CM | POA: Insufficient documentation

## 2015-06-25 DIAGNOSIS — R569 Unspecified convulsions: Secondary | ICD-10-CM | POA: Insufficient documentation

## 2015-06-25 DIAGNOSIS — Z79891 Long term (current) use of opiate analgesic: Secondary | ICD-10-CM | POA: Insufficient documentation

## 2015-06-25 DIAGNOSIS — Z3202 Encounter for pregnancy test, result negative: Secondary | ICD-10-CM | POA: Insufficient documentation

## 2015-06-25 DIAGNOSIS — Z79899 Other long term (current) drug therapy: Secondary | ICD-10-CM | POA: Diagnosis not present

## 2015-06-25 DIAGNOSIS — R55 Syncope and collapse: Secondary | ICD-10-CM | POA: Diagnosis present

## 2015-06-25 LAB — PREGNANCY, URINE: Preg Test, Ur: NEGATIVE

## 2015-06-25 LAB — BASIC METABOLIC PANEL
ANION GAP: 7 (ref 5–15)
BUN: 11 mg/dL (ref 6–20)
CO2: 25 mmol/L (ref 22–32)
CREATININE: 0.81 mg/dL (ref 0.44–1.00)
Calcium: 9.1 mg/dL (ref 8.9–10.3)
Chloride: 107 mmol/L (ref 101–111)
Glucose, Bld: 89 mg/dL (ref 65–99)
Potassium: 3.4 mmol/L — ABNORMAL LOW (ref 3.5–5.1)
SODIUM: 139 mmol/L (ref 135–145)

## 2015-06-25 LAB — CBC
HEMATOCRIT: 36.9 % (ref 35.0–47.0)
Hemoglobin: 12.3 g/dL (ref 12.0–16.0)
MCH: 29 pg (ref 26.0–34.0)
MCHC: 33.5 g/dL (ref 32.0–36.0)
MCV: 86.6 fL (ref 80.0–100.0)
Platelets: 286 10*3/uL (ref 150–440)
RBC: 4.26 MIL/uL (ref 3.80–5.20)
RDW: 13.2 % (ref 11.5–14.5)
WBC: 10.9 10*3/uL (ref 3.6–11.0)

## 2015-06-25 LAB — POCT PREGNANCY, URINE: Preg Test, Ur: NEGATIVE

## 2015-06-25 MED ORDER — CLONAZEPAM 0.5 MG PO TABS: 0.5000 mg | ORAL_TABLET | Freq: Two times a day (BID) | ORAL | Status: DC | PRN

## 2015-06-25 MED ORDER — ONDANSETRON HCL 4 MG/2ML IJ SOLN
INTRAMUSCULAR | Status: AC
  Administered 2015-06-25: 4 mg via INTRAVENOUS
  Filled 2015-06-25: qty 2

## 2015-06-25 MED ORDER — CLONIDINE HCL 0.1 MG PO TABS
0.2000 mg | ORAL_TABLET | Freq: Once | ORAL | Status: DC
  Filled 2015-06-25: qty 2

## 2015-06-25 MED ORDER — ONDANSETRON HCL 4 MG/2ML IJ SOLN
4.0000 mg | Freq: Once | INTRAMUSCULAR | Status: AC
  Administered 2015-06-25: 4 mg via INTRAVENOUS

## 2015-06-25 MED ORDER — CLONAZEPAM 0.5 MG PO TABS
0.5000 mg | ORAL_TABLET | Freq: Once | ORAL | Status: AC
Start: 2015-06-25 — End: 2015-06-25
  Administered 2015-06-25: 0.5 mg via ORAL
  Filled 2015-06-25: qty 1

## 2015-06-25 NOTE — ED Provider Notes (Signed)
[facility] Emergency Department Provider Note   ____________________________________________  Time seen: 24  I have reviewed the triage vital signs and the nursing notes.   HISTORY  Chief Complaint Loss of Consciousness and Febrile Seizure   History limited by: Not Limited   HPI Becky Mata is a 34 y.o. female who presents to the emergency department today via EMS for episode of possible seizure. 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 She states she had no warning but then recalls waking up. The client stated that she passed out and had a seizure. Unfortunately there is no further details about any activity and what the client saw that made them think she had a seizure. There is no incontinence or tongue trauma. Patient states she has no histories of seizure. She has had a little bit of sleep deprivation secondary to children recently. Denies any alcohol or drug use. States she passed out once in the past number of years ago was donating blood. Otherwise the patient states he felt fine this morning without any fevers, nausea or vomiting.     History reviewed. No pertinent past medical history.  Patient Active Problem List   Diagnosis Date Noted  . 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 Anxiety state, unspecified 01/18/2013  . Hip pain 06/16/2011    Past Surgical History  Procedure Laterality Date  . Cesarean section  2012  . Colposcopy w/ biopsy / curettage  ~2009    Current Outpatient Rx  Name  Route  Sig  Dispense  Refill  . ALPRAZolam (XANAX) 0.25 MG tablet      TAKE 1 TABLET BY MOUTH THREE TIMES DAILY AS NEEDED FOR SLEEP   60 tablet   0   . buPROPion (WELLBUTRIN XL) 300 MG 24 hr tablet      TAKE 1 TABLET BY MOUTH EVERY DAY   30 tablet   11   . clonazePAM (KLONOPIN) 0.5 MG tablet      TAKE 1 TABLET TWICE DAILY AS NEEDED   60 tablet   0   . norethindrone-ethinyl estradiol (MICROGESTIN  FE 1/20) 1-20 MG-MCG tablet   Oral   Take 1 tablet by mouth daily.   1 Package   11   . nystatin cream (MYCOSTATIN)      APPLY EXTERNALLY AS DIRECTED NIGHTLY FOR 14 DAYS   60 g   0   . sulfamethoxazole-trimethoprim (BACTRIM DS) 800-160 MG per tablet   Oral   Take 1 tablet by mouth 2 (two) times daily.   20 tablet   2     For 3 days--then 1 after sex   . traMADol (ULTRAM) 50 MG tablet      TAKE 1 TABLET BY MOUTH TWICE DAILY   60 tablet   0     Allergies Review of patient's allergies indicates no known allergies.  Family History  Problem Relation Age of Onset  . Arthritis Mother     rheumatoid arthritis  . Alcohol abuse Mother   . Hypertension Father   . Diabetes Maternal Grandmother   . Fibromyalgia Maternal Grandmother   . Heart disease Neg Hx     Social History Social History  Substance Use Topics  . Smoking status: Never Smoker   . Smokeless tobacco: Never Used  . Alcohol Use: No    Review of Systems  Constitutional: Negative for fever. Cardiovascular: Negative for chest pain. Respiratory: Negative for shortness of breath. Gastrointestinal: Negative for abdominal  pain, vomiting and diarrhea. Genitourinary: Negative for dysuria. Musculoskeletal: Negative for back pain. Skin: Negative for rash. Neurological: Possible seizure activity 10-point ROS otherwise negative.  ____________________________________________   PHYSICAL EXAM:  VITAL SIGNS: ED Triage Vitals  Enc Vitals Group     BP 06/25/15 1735 117/75 mmHg     Pulse Rate 06/25/15 1735 107     Resp 06/25/15 1735 22     Temp 06/25/15 1735 98.5 F (36.9 C)     Temp Source 06/25/15 1735 Oral     SpO2 06/25/15 1735 99 %     Weight 06/25/15 1735 130 lb (58.968 kg)     Height 06/25/15 1735  (1.676 m)     Head Cir --      Peak Flow --      Pain Score 06/25/15 1738 5   Constitutional: Alert and oriented. Well appearing and in no distress. Eyes: Conjunctivae are normal. PERRL. Normal  extraocular movements. ENT   Head: Normocephalic and atraumatic.   Nose: No congestion/rhinnorhea.   Mouth/Throat: Mucous membranes are moist.   Neck: No stridor. Hematological/Lymphatic/Immunilogical: No cervical lymphadenopathy. Cardiovascular: Normal rate, regular rhythm.  No murmurs, rubs, or gallops. Respiratory: Normal respiratory effort without tachypnea nor retractions. Breath sounds are clear and equal bilaterally. No wheezes/rales/rhonchi. Gastrointestinal: Soft and nontender. No distention.  Genitourinary: Deferred Musculoskeletal: Normal range of motion in all extremities. No joint effusions.  No lower extremity tenderness nor edema. Neurologic:  Normal speech and language. No gross focal neurologic deficits are appreciated. Speech is normal.  Skin:  Skin is warm, dry and intact. No rash noted. Psychiatric: Mood and affect are normal. Speech and behavior are normal. Patient exhibits appropriate insight and judgment.  ____________________________________________    LABS (pertinent positives/negatives)  Labs Reviewed  BASIC METABOLIC PANEL - Abnormal; Notable for the following:    Potassium 3.4 (*)    All other components within normal limits  CBC  POC URINE PREG, ED  POCT PREGNANCY, URINE     ____________________________________________  EKG  I, Phineas Semen, attending physician, personally viewed and interpreted this EKG  EKG Time: 1804 Rate: 92 Rhythm: NSR Axis: normal Intervals: qtc 445 QRS: narrow ST changes: no st elevation Impression: normal EKG ____________________________________________    RADIOLOGY  CT head IMPRESSION: 1. Normal intracranially. 2. 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  ____________________________________________   PROCEDURES  Procedure(s) performed: None  Critical Care performed: No  ____________________________________________   INITIAL IMPRESSION / ASSESSMENT AND PLAN /  ED COURSE  Pertinent labs & imaging results that were available during my care of the patient were reviewed by me and considered in my medical decision making (see chart for details).  Patient presented to the emergency department today with concern for possible seizure activity. On exam here no focal neuro deficits. Talking further with the patient appears that she had been on Klonopin daily but ran out of her prescription one week ago. Additionally the patient has been intermittently taking Wellbutrin. I discussed with the patient importance of taking medication as prescribed. Also discussed with patient seizure precautions including the fact that she cannot drive until cleared by neurologist. At this point the exact etiology of this seizure like activity is unclear. I'm not completely convinced that she did have a true seizure. However she did have a seizure on her medication withdrawal and use could be the factor.  ____________________________________________   FINAL CLINICAL IMPRESSION(S) / ED DIAGNOSES  Final diagnoses:  Seizure-like activity  Phineas Semen, MD 06/25/15 2145

## 2015-06-25 NOTE — Discharge Instructions (Signed)
As we discussed it is very important that you do not drive, go up on roofs, swim, or put herself in any situation that might be dangerous for your others if you're to have another seizure until you are cleared by a neurologist.  Seizure, Adult A seizure is abnormal electrical activity in the brain. Seizures usually last from 30 seconds to 2 minutes. There are various types of seizures. Before a seizure, you may have a warning sensation (aura) that a seizure is about to occur. An aura may include the following symptoms:   Fear or anxiety.  Nausea.  Feeling like the room is spinning (vertigo).  Vision changes, such as seeing flashing lights or spots. Common symptoms during a seizure include:  A change in attention or behavior (altered mental status).  Convulsions with rhythmic jerking movements.  Drooling.  Rapid eye movements.  Grunting.  Loss of bladder and bowel control.  Bitter taste in the mouth.  Tongue biting. After a seizure, you may feel confused and sleepy. You may also have an injury resulting from convulsions during the seizure. HOME CARE INSTRUCTIONS   If you are given medicines, take them exactly as prescribed by your health care provider.  Keep all follow-up appointments as directed by your health care provider.  Do not swim or drive or engage in risky activity during which a seizure could cause further injury to you or others until your health care provider says it is OK.  Get adequate rest.  Teach friends and family what to do if you have a seizure. They should:  Lay you on the ground to prevent a fall.  Put a cushion under your head.  Loosen any tight clothing around your neck.  Turn you on your side. If vomiting occurs, this helps keep your airway clear.  Stay with you until you recover.  Know whether or not you need emergency care. SEEK IMMEDIATE MEDICAL CARE IF:  The seizure lasts longer than 5 minutes.  The seizure is severe or you do not  wake up immediately after the seizure.  You have an altered mental status after the seizure.  You are having more frequent or worsening seizures. Someone should drive you to the emergency department or call local emergency services (911 in [place] MAKE SURE YOU:  Understand these instructions.  Will watch your condition.  Will get help right away if you are not doing well or get worse. Document Released: 09/25/2000 Document Revised: 07/19/2013 Document Reviewed: 05/10/2013 [facility] Patient Information 2015 [place], [place]. This information is not intended to replace advice given to you by your health care provider. Make sure you discuss any questions you have with your health care provider.

## 2015-06-25 NOTE — ED Notes (Signed)
Patient brought to ED by EMS from patient's home. 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 Patient now c/o pain in hips and back and of feeling very weak at this time. Patient was not post ictal after episode.

## 2015-06-26 ENCOUNTER — Ambulatory Visit: Payer: Self-pay | Admitting: Internal Medicine

## 2015-06-26 ENCOUNTER — Telehealth: Payer: Self-pay | Admitting: Internal Medicine

## 2015-06-26 NOTE — Telephone Encounter (Signed)
This pt was seen in ER, cleared of any seizure activity. This should be followed up by PCP.

## 2015-06-26 NOTE — Telephone Encounter (Signed)
Pt has appt with R Baity NP 06/26/15 at 3pm.

## 2015-06-26 NOTE — Telephone Encounter (Signed)
Patient Name: Becky Mata DOB: April 09, 1981 Initial Comment Caller states she is on anxiety meds and tramidol. Had been on wellbutrin, but stopped, restarted last week. Was seen for chest tightness and body twitching, seizure-like issues. Now feels nervous. Nurse Assessment Nurse: Becky Birk, RN, Becky Mata Date/Time (Eastern Time): 06/26/2015 11:21:03 AM Confirm and document reason for call. If symptomatic, describe symptoms. ---Caller states she is on anxiety meds. and Tramadol. Had been on Wellbutrin, but stopped for months, restarted last week. Had run out of Klonopin. Was seen in ER for passing out and body twitching, seizure-like issues, was out for 5 mins. Did not have recollection of event when she woke up. Symptoms started again a couple hours after being in ER with nervousness/ stomach knot & twitching. Gave her Zofran & Klonopin. Now, feels nervous. Did CAT scan of her head. They want her to see a neurologist. Caller feels it may have been related to rxs. Had been on caffeine drugs OTC. Has the patient traveled out of the country within the last 30 days? ---Not Applicable Does the patient require triage? ---Yes Related visit to physician within the last 2 weeks? ---Yes Does the PT have any chronic conditions? (i.e. diabetes, asthma, etc.) ---Yes List chronic conditions. ---anxiety, on Tramadol for back pain, possible seizure like activity Did the patient indicate they were pregnant? ---No Guidelines Guideline Title Affirmed Question Affirmed Notes Anxiety and Panic Attack Symptoms interfere with work or school Final Disposition User See Physician within 24 Hours [place], Becky Mata, Becky Mata states she is struggling with how to deal with medications she takes, does not want to become addicted, but feels she needs the Klonopin. They prescribed her some at the ER, but she does not want to get that rx filled as she gets them through Becky Mata - 60.5 prn every 2 months  which is not enough. She is asking if that rx refill can be moved up a day, due for more tomorrow. She uses the [facility] on [address] in [place]. Scheduled to be seen this afternoon with nurse practitioner. Referrals REFERRED TO PCP OFFICE PLEASE NOTE: All timestamps contained within this report are represented as [place] Standard Time. CONFIDENTIALTY NOTICE: This fax transmission is intended only for the addressee. It contains information that is legally privileged, confidential or otherwise protected from use or disclosure. If you are not the intended recipient, you are strictly prohibited from reviewing, disclosing, copying using or disseminating any of this information or taking any action in reliance on or regarding this information. If you have received this fax in error, please notify us immediately by telephone so that we can arrange for its return to [place]. Phone: [phone], Toll-Free: [phone], Fax: [phone] Page: 2 of 2 Call Id: [id number] Disagree/Comply: Becky Mata

## 2015-06-27 ENCOUNTER — Ambulatory Visit (INDEPENDENT_AMBULATORY_CARE_PROVIDER_SITE_OTHER): Payer: PRIVATE HEALTH INSURANCE | Admitting: Internal Medicine

## 2015-06-27 ENCOUNTER — Encounter: Payer: Self-pay | Admitting: Internal Medicine

## 2015-06-27 VITALS — BP 106/72 | HR 67 | Temp 98.2°F | Wt 126.0 lb

## 2015-06-27 DIAGNOSIS — R55 Syncope and collapse: Secondary | ICD-10-CM

## 2015-06-27 DIAGNOSIS — F39 Unspecified mood [affective] disorder: Secondary | ICD-10-CM | POA: Diagnosis not present

## 2015-06-27 MED ORDER — DULOXETINE HCL 30 MG PO CPEP: 30.0000 mg | ORAL_CAPSULE | Freq: Every day | ORAL | Status: DC

## 2015-06-27 MED ORDER — CLONAZEPAM 0.5 MG PO TABS: 0.5000 mg | ORAL_TABLET | Freq: Two times a day (BID) | ORAL | Status: DC | PRN

## 2015-06-27 NOTE — Assessment & Plan Note (Signed)
Sounds vagovasal or heat related Some abnormal movements but no incontinence or tongue bite Had spell of abnormal movements in ER that seemed anxiety related (no LOC)  Has been told not to drive till neuro eval --but this is not very reasonable with very nebulous history Both bupropion and tramadol can reduce seizure threshold--will stop both Will try to get her in with neurologist ASAP--- though I don't think she had a seizure

## 2015-06-27 NOTE — Progress Notes (Signed)
Pre visit review using our clinic review tool, if applicable. No additional management support is needed unless otherwise documented below in the visit note. 

## 2015-06-27 NOTE — Progress Notes (Signed)
Subjective:    Patient ID: Becky Mata, female    DOB: 03/11/1981, 34 y.o.   MRN: [id number]  HPI Here for ER follow up and mood issues  She doesn't really remember what happened Thinks the wellbutrin had been working well-- but stopped in February because she felt more "even" And wasn't "snapping" as much  Took a few doses of bupropion in the past week  Still has stress around the house Still feels she "has to walk around on eggshells"--- "just enough love between [place] to keep trying" Hasn't been able to hook up with a marriage counselor Has been using the clonazepam for sleep--but then ran out  She was having a bad day with pain At her last client's house Very hot--A/C not on there and she was sweating Bending over to work with child on scooter---and doesn't remember what happened Was sitting on floor when came to. Client's family freaking out-- she was shaking for ?5 minutes  Brought to ER Work up being done Then felt stomach started cramping --- finally went into some "writhing movements" like sound chorea-like. No LOC then. Better after got some clonazepam there (lasted 30 minutes)  Current Outpatient Prescriptions on File Prior to Visit  Medication Sig Dispense Refill  . ALPRAZolam (XANAX) 0.25 MG tablet TAKE 1 TABLET BY MOUTH THREE TIMES DAILY AS NEEDED FOR SLEEP 60 tablet 0  . buPROPion (WELLBUTRIN XL) 300 MG 24 hr tablet TAKE 1 TABLET BY MOUTH EVERY DAY 30 tablet 11  . clonazePAM (KLONOPIN) 0.5 MG tablet TAKE 1 TABLET TWICE DAILY AS NEEDED 60 tablet 0  . norethindrone-ethinyl estradiol (MICROGESTIN FE 1/20) 1-20 MG-MCG tablet Take 1 tablet by mouth daily. 1 Package 11  . nystatin cream (MYCOSTATIN) APPLY EXTERNALLY AS DIRECTED NIGHTLY FOR 14 DAYS 60 g 0  . traMADol (ULTRAM) 50 MG tablet TAKE 1 TABLET BY MOUTH TWICE DAILY 60 tablet 0  . clonazePAM (KLONOPIN) 0.5 MG tablet Take 1 tablet (0.5 mg total) by mouth 2 (two) times daily as needed for anxiety. (Patient not  taking: Reported on 06/27/2015) 10 tablet 0   No current facility-administered medications on file prior to visit.    No Known Allergies  No past medical history on file.  Past Surgical History  Procedure Laterality Date  . Cesarean section  2012  . Colposcopy w/ biopsy / curettage  ~2009    Family History  Problem Relation Age of Onset  . Arthritis Mother     rheumatoid arthritis  . Alcohol abuse Mother   . Hypertension Father   . Diabetes Maternal Grandmother   . Fibromyalgia Maternal Grandmother   . Heart disease Neg Hx     Social History   Social History  . Marital Status: Married    Spouse Name: N/A  . Number of Children: 1  . Years of Education: N/A   Occupational History  . PT aide    Social History Main Topics  . Smoking status: Never Smoker   . Smokeless tobacco: Never Used  . Alcohol Use: No  . Drug Use: No  . Sexual Activity: Not on file   Other Topics Concern  . Not on file   Social History Narrative   Review of Systems Gets pain all over---very distracting and keeps her from sleeping well Tramadol not helping this anymore Didn't eat much the day of the event-- stomach worked up     Objective:   Physical Exam  Constitutional: She is oriented to person, place,  and time. She appears well-developed and well-nourished. No distress.  Neurological: She is alert and oriented to person, place, and time. No cranial nerve deficit. She exhibits normal muscle tone. Coordination normal.  Psychiatric:  Anxious  Somewhat pressured speech          Assessment & Plan:

## 2015-06-27 NOTE — Assessment & Plan Note (Signed)
Chronic anxiety Dysthymia now Still lots of life stress Will try duloxetine--- may help her chronic pain as well

## 2015-07-08 ENCOUNTER — Emergency Department
Admission: EM | Admit: 2015-07-08 | Discharge: 2015-07-08 | Disposition: A | Discharge status: Home / Self Care | Payer: 59 | Attending: Student | Admitting: Student

## 2015-07-08 DIAGNOSIS — Z793 Long term (current) use of hormonal contraceptives: Secondary | ICD-10-CM | POA: Diagnosis not present

## 2015-07-08 DIAGNOSIS — F439 Reaction to severe stress, unspecified: Secondary | ICD-10-CM | POA: Insufficient documentation

## 2015-07-08 DIAGNOSIS — Z79899 Other long term (current) drug therapy: Secondary | ICD-10-CM | POA: Insufficient documentation

## 2015-07-08 DIAGNOSIS — Z3202 Encounter for pregnancy test, result negative: Secondary | ICD-10-CM | POA: Insufficient documentation

## 2015-07-08 DIAGNOSIS — F419 Anxiety disorder, unspecified: Secondary | ICD-10-CM | POA: Insufficient documentation

## 2015-07-08 DIAGNOSIS — F1210 Cannabis abuse, uncomplicated: Secondary | ICD-10-CM | POA: Diagnosis not present

## 2015-07-08 LAB — URINALYSIS COMPLETE WITH MICROSCOPIC (ARMC ONLY)
BILIRUBIN URINE: NEGATIVE
Bacteria, UA: NONE SEEN
Glucose, UA: NEGATIVE mg/dL
HGB URINE DIPSTICK: NEGATIVE
KETONES UR: NEGATIVE mg/dL
LEUKOCYTES UA: NEGATIVE
NITRITE: NEGATIVE
PH: 7 (ref 5.0–8.0)
Protein, ur: NEGATIVE mg/dL
SPECIFIC GRAVITY, URINE: 1.014 (ref 1.005–1.030)

## 2015-07-08 LAB — CBC
HCT: 38.8 % (ref 35.0–47.0)
HEMOGLOBIN: 13 g/dL (ref 12.0–16.0)
MCH: 29.3 pg (ref 26.0–34.0)
MCHC: 33.5 g/dL (ref 32.0–36.0)
MCV: 87.6 fL (ref 80.0–100.0)
Platelets: 290 10*3/uL (ref 150–440)
RBC: 4.43 MIL/uL (ref 3.80–5.20)
RDW: 13.2 % (ref 11.5–14.5)
WBC: 8.2 10*3/uL (ref 3.6–11.0)

## 2015-07-08 LAB — COMPREHENSIVE METABOLIC PANEL
ALBUMIN: 4.5 g/dL (ref 3.5–5.0)
ALK PHOS: 51 U/L (ref 38–126)
ALT: 16 U/L (ref 14–54)
ANION GAP: 8 (ref 5–15)
AST: 24 U/L (ref 15–41)
BUN: 13 mg/dL (ref 6–20)
CALCIUM: 9.5 mg/dL (ref 8.9–10.3)
CO2: 26 mmol/L (ref 22–32)
Chloride: 105 mmol/L (ref 101–111)
Creatinine, Ser: 0.73 mg/dL (ref 0.44–1.00)
GFR calc non Af Amer: >60 mL/min (ref >60)
Glucose, Bld: 105 mg/dL — ABNORMAL HIGH (ref 65–99)
POTASSIUM: 4.3 mmol/L (ref 3.5–5.1)
SODIUM: 139 mmol/L (ref 135–145)
TOTAL PROTEIN: 7.6 g/dL (ref 6.5–8.1)
Total Bilirubin: 0.3 mg/dL (ref 0.3–1.2)

## 2015-07-08 LAB — URINE DRUG SCREEN, QUALITATIVE (ARMC ONLY)
AMPHETAMINES, UR SCREEN: NOT DETECTED
BARBITURATES, UR SCREEN: NOT DETECTED
BENZODIAZEPINE, UR SCRN: NOT DETECTED
Cannabinoid 50 Ng, Ur ~~LOC~~: POSITIVE — AB
Cocaine Metabolite,Ur ~~LOC~~: NOT DETECTED
MDMA (Ecstasy)Ur Screen: NOT DETECTED
METHADONE SCREEN, URINE: NOT DETECTED
Opiate, Ur Screen: NOT DETECTED
Phencyclidine (PCP) Ur S: NOT DETECTED
TRICYCLIC, UR SCREEN: NOT DETECTED

## 2015-07-08 LAB — PREGNANCY, URINE: Preg Test, Ur: NEGATIVE

## 2015-07-08 LAB — ETHANOL: Alcohol, Ethyl (B): <5 mg/dL (ref <5)

## 2015-07-08 LAB — SALICYLATE LEVEL

## 2015-07-08 LAB — ACETAMINOPHEN LEVEL

## 2015-07-08 NOTE — ED Notes (Signed)
ED BHU PLACEMENT JUSTIFICATION Is the patient under IVC or is there intent for IVC: No. Is the patient medically cleared: Yes.   Is there vacancy in the ED BHU: Yes.   Is the population mix appropriate for patient: Yes.   Is the patient awaiting placement in inpatient or outpatient setting: Yes.   Has the patient had a psychiatric consult: No. Survey of unit performed for contraband, proper placement and condition of furniture, tampering with fixtures in bathroom, shower, and each patient room: Yes.  ; Findings:  APPEARANCE/BEHAVIOR calm NEURO ASSESSMENT Orientation: time, place and person Hallucinations: No.None noted (Hallucinations) Speech: Normal Gait: normal RESPIRATORY ASSESSMENT Normal expansion.  Clear to auscultation.  No rales, rhonchi, or wheezing. CARDIOVASCULAR ASSESSMENT regular rate and rhythm, S1, S2 normal, no murmur, click, rub or gallop GASTROINTESTINAL ASSESSMENT soft, nontender, BS WNL, no r/g EXTREMITIES normal strength, tone, and muscle mass PLAN OF CARE Provide calm/safe environment. Vital signs assessed twice daily. ED BHU Assessment once each 12-hour shift. Collaborate with intake RN daily or as condition indicates. Assure the ED provider has rounded once each shift. Provide and encourage hygiene. Provide redirection as needed. Assess for escalating behavior; address immediately and inform ED provider.  Assess family dynamic and appropriateness for visitation as needed: Yes.  ; If necessary, describe findings:  Educate the patient/family about BHU procedures/visitation: Yes.  ; If necessary, describe findings:

## 2015-07-08 NOTE — ED Notes (Signed)
Pt reports was seen 2 weeks ago for what was thought to have been seizures. Pt reports thinks it may be stress related. Pt husband at patients side, he called her MD and was told that she may be on the verge of a mental break and to come to the ED immediately. Pt moving constantly in the triage room.

## 2015-07-08 NOTE — ED Notes (Signed)
Pt reports feeling stressed out. Pt states " I feel like I am going to have a break". Pt denies SI/hi and is in no acute distress

## 2015-07-08 NOTE — ED Provider Notes (Signed)
[facility] Emergency Department Provider Note  ____________________________________________  Time seen: Approximately 3:10 PM  I have reviewed the triage vital signs and the nursing notes.   HISTORY  Chief Complaint Stress and Anxiety    HPI Becky Mata is a 34 y.o. female with history of chronic pain, mild personality disorder who presents for evaluation of severe anxiety, worsening over the past 2 weeks, gradual onset, constant, currently severe. Patient reports that she was seen here 2 weeks ago for possible seizure versus syncope. She has had no recurrence of either of those events but reports that she has been very anxious because she is under "the most stress in every area of my life". She received word today that her grandmother was admitted to the hospital for TIAs and she has been very anxious since that time. She feels a "knot in my stomach" and very anxious. She feels as if she is on the verge of a nervous breakdown. No chest pain or difficulty breathing. Currently her symptoms are moderate to severe. Other than stress there are no modifying factors.   No past medical history on file.  Patient Active Problem List   Diagnosis Date Noted  . Syncope 06/27/2015  . 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 Mood disorder 01/18/2013  . Hip pain 06/16/2011    Past Surgical History  Procedure Laterality Date  . Cesarean section  2012  . Colposcopy w/ biopsy / curettage  ~2009    Current Outpatient Rx  Name  Route  Sig  Dispense  Refill  . clonazePAM (KLONOPIN) 0.5 MG tablet   Oral   Take 1 tablet (0.5 mg total) by mouth 2 (two) times daily as needed.   60 tablet   0   . DULoxetine (CYMBALTA) 30 MG capsule   Oral   Take 1 capsule (30 mg total) by mouth daily.   30 capsule   3   . norethindrone-ethinyl estradiol (MICROGESTIN FE 1/20) 1-20 MG-MCG tablet   Oral   Take 1 tablet by mouth daily.   1 Package   11   . nystatin cream  (MYCOSTATIN)      APPLY EXTERNALLY AS DIRECTED NIGHTLY FOR 14 DAYS   60 g   0     Allergies Review of patient's allergies indicates no known allergies.  Family History  Problem Relation Age of Onset  . Arthritis Mother     rheumatoid arthritis  . Alcohol abuse Mother   . Hypertension Father   . Diabetes Maternal Grandmother   . Fibromyalgia Maternal Grandmother   . Heart disease Neg Hx     Social History Social History  Substance Use Topics  . Smoking status: Never Smoker   . Smokeless tobacco: Never Used  . Alcohol Use: No    Review of Systems Constitutional: No fever/chills Eyes: No visual changes. ENT: No sore throat. Cardiovascular: Denies chest pain. Respiratory: Denies shortness of breath. Gastrointestinal: No abdominal pain.  No nausea, no vomiting.  No diarrhea.  No constipation. Genitourinary: Negative for dysuria. Musculoskeletal: Negative for back pain. Skin: Negative for rash. Neurological: Negative for headaches, focal weakness or numbness.  10-point ROS otherwise negative.  ____________________________________________   PHYSICAL EXAM:  VITAL SIGNS: ED Triage Vitals  Enc Vitals Group     BP 07/08/15 1431 116/83 mmHg     Pulse Rate 07/08/15 1431 100     Resp 07/08/15 1431 20     Temp 07/08/15 1431 98.1 F (36.7 C)     Temp  Source 07/08/15 1431 Oral     SpO2 07/08/15 1431 100 %     Weight 07/08/15 1431 127 lb (57.607 kg)     Height 07/08/15 1431  (1.676 m)     Head Cir --      Peak Flow --      Pain Score --      Pain Loc --      Pain Edu? --      Excl. in GC? --     Constitutional: Alert and oriented. Well appearing and in no acute distress. Eyes: Conjunctivae are normal. PERRL. EOMI. Head: Atraumatic. Nose: No congestion/rhinnorhea. Mouth/Throat: Mucous membranes are moist.  Oropharynx non-erythematous. Neck: No stridor.  Cardiovascular: Normal rate, regular rhythm. Grossly normal heart sounds.  Good peripheral  circulation. Respiratory: Normal respiratory effort.  No retractions. Lungs CTAB. Gastrointestinal: Soft and nontender. No distention. No abdominal bruits. No CVA tenderness. Genitourinary: deferred Musculoskeletal: No lower extremity tenderness nor edema.  No joint effusions. Neurologic:  Normal speech and language. No gross focal neurologic deficits are appreciated. No gait instability. Skin:  Skin is warm, dry and intact. No rash noted. Psychiatric: Mood is somewhat labile, affect is expansive. Speech and behavior are normal.  ____________________________________________   LABS (all labs ordered are listed, but only abnormal results are displayed)  Labs Reviewed  COMPREHENSIVE METABOLIC PANEL - Abnormal; Notable for the following:    Glucose, Bld 105 (*)    All other components within normal limits  ACETAMINOPHEN LEVEL - Abnormal; Notable for the following:    Acetaminophen (Tylenol), Serum <10 (*)    All other components within normal limits  URINE DRUG SCREEN, QUALITATIVE ([facility] ONLY) - Abnormal; Notable for the following:    Cannabinoid 50 Ng, Ur [place] POSITIVE (*)    All other components within normal limits  URINALYSIS COMPLETEWITH MICROSCOPIC ([facility] ONLY) - Abnormal; Notable for the following:    Color, Urine YELLOW (*)    APPearance CLEAR (*)    Squamous Epithelial / LPF 0-5 (*)    All other components within normal limits  ETHANOL  SALICYLATE LEVEL  CBC  PREGNANCY, URINE   ____________________________________________  EKG  none ____________________________________________  RADIOLOGY  none ____________________________________________   PROCEDURES  Procedure(s) performed: None  Critical Care performed: No  ____________________________________________   INITIAL IMPRESSION / ASSESSMENT AND PLAN / ED COURSE  Pertinent labs & imaging results that were available during my care of the patient were reviewed by me and considered in my medical decision making  (see chart for details).  Becky Mata is a 34 y.o. female with history of chronic pain, mild personality disorder who presents for evaluation of severe anxiety. On exam, she is nontoxic appearing and in no acute distress. Vital signs stable, she is afebrile. She has no acute medical complaints. She denies any suicidal ideation, homicidal ideation or audiovisual hallucinations. No indications for involuntary commitment. Plan for psych screening labs, consult TTS and psychiatry.  ----------------------------------------- 6:57 PM on 07/08/2015 -----------------------------------------  Labs reviewed. Normal CBC and CMP. Negative urine pregnancy. Urinalysis consistent with infection. Undetectable ethanol salicylate and acetaminophen levels. Urine drug screen positive for cannabis. At this time, psychiatry has not yet been by to evaluate the patient and she reports that she would like to go home. She has no suicidal ideation, audiovisual hallucinations or homicidal ideation and is free to leave. We'll discharge with [facility] follow-up instructions. ____________________________________________   FINAL CLINICAL IMPRESSION(S) / ED DIAGNOSES  Final diagnoses:  Anxiety  Gayla Doss, MD 07/08/15 [phone]

## 2015-07-09 ENCOUNTER — Other Ambulatory Visit: Payer: Self-pay | Admitting: Internal Medicine

## 2015-07-09 ENCOUNTER — Ambulatory Visit (INDEPENDENT_AMBULATORY_CARE_PROVIDER_SITE_OTHER): Payer: PRIVATE HEALTH INSURANCE | Admitting: Internal Medicine

## 2015-07-09 ENCOUNTER — Encounter: Payer: Self-pay | Admitting: Internal Medicine

## 2015-07-09 VITALS — BP 100/60 | HR 68 | Temp 97.8°F | Wt 126.0 lb

## 2015-07-09 DIAGNOSIS — F39 Unspecified mood [affective] disorder: Secondary | ICD-10-CM

## 2015-07-09 MED ORDER — QUETIAPINE FUMARATE 50 MG PO TABS: 50.0000 mg | ORAL_TABLET | Freq: Every day | ORAL | Status: DC

## 2015-07-09 NOTE — Assessment & Plan Note (Signed)
Has features of GAD with panic but now the history seems to be consistent with bipolar disorder She had reached out for a psychiatrist Will start quetiapine as mood stabilizer Continue the duloxetine  Counseled over half of 25 minute visit Will have early follow up again

## 2015-07-09 NOTE — Progress Notes (Signed)
Pre visit review using our clinic review tool, if applicable. No additional management support is needed unless otherwise documented below in the visit note. 

## 2015-07-09 NOTE — Patient Instructions (Signed)
Please start the quetiapine with 1 tab at bedtime ( ). If you don't have any problems after 3 days, increase to 2 tabs ( ).

## 2015-07-09 NOTE — Telephone Encounter (Signed)
rx sent to pharmacy by e-script  

## 2015-07-09 NOTE — Progress Notes (Signed)
   Subjective:    Patient ID: Becky Mata, female    DOB: 06-11-1981, 34 y.o.   MRN: [id number]  HPI Here for follow up of anxiety and other symptoms--was in ER yesterday Note reviewed Husband is here  Having bad anxiety attacks "things happening to my body that I cannot stop" Thinking back to 2 weeks ago--she thinks this was an anxiety spell  [place] Wasn[place] a vacation--watching over mom and 60 year old She has driven some Husband and her still fighting--but are determined to go to counselor She has called to get in with a psychiatrist  Had attack yesterday--teeth chattering, stomach knotted up, etc This happened after hearing her GM was hospitalized and may be having TIAs Took a number of the clonazepam but though it helped somewhat--not enough  She feels the cymbalta is helping the nerve pain This has her worried that something is worse in her back than she thought  Husband notes mood swings Wide swings from one minute to the next "fine one minute--then angry and out of control the next" Gets ready to "smash stuff" quickly (her words)  Current Outpatient Prescriptions on File Prior to Visit  Medication Sig Dispense Refill  . clonazePAM (KLONOPIN) 0.5 MG tablet Take 1 tablet (0.5 mg total) by mouth 2 (two) times daily as needed. 60 tablet 0  . DULoxetine (CYMBALTA) 30 MG capsule Take 1 capsule (30 mg total) by mouth daily. 30 capsule 3  . norethindrone-ethinyl estradiol (MICROGESTIN FE 1/20) 1-20 MG-MCG tablet Take 1 tablet by mouth daily. 1 Package 11  . nystatin cream (MYCOSTATIN) APPLY EXTERNALLY AS DIRECTED NIGHTLY FOR 14 DAYS 60 g 0   No current facility-administered medications on file prior to visit.    No Known Allergies  No past medical history on file.  Past Surgical History  Procedure Laterality Date  . Cesarean section  2012  . Colposcopy w/ biopsy / curettage  ~2009    Family History  Problem Relation Age of Onset  . Arthritis  Mother     rheumatoid arthritis  . Alcohol abuse Mother   . Hypertension Father   . Diabetes Maternal Grandmother   . Fibromyalgia Maternal Grandmother   . Heart disease Neg Hx     Social History   Social History  . Marital Status: Married    Spouse Name: N/A  . Number of Children: 1  . Years of Education: N/A   Occupational History  . PT aide [employer]   Social History Main Topics  . Smoking status: Never Smoker   . Smokeless tobacco: Never Used  . Alcohol Use: No  . Drug Use: No  . Sexual Activity: Not on file   Other Topics Concern  . Not on file   Social History Narrative   Review of Systems She feels job triggers stress---husband thinks "anything" can Applying for job with [employer]--but can't get through the application on line Husband feels this all started with birth of daughter and now worsened over time Sister is bipolar Does have lifelong history of "flying off the handle"    Objective:   Physical Exam  Constitutional: She appears well-developed and well-nourished. No distress.  Psychiatric:  Normal speech and appearance Does have some insight  Judgement doesn't really seem impaired No psychotic features now          Assessment & Plan:

## 2015-07-09 NOTE — Telephone Encounter (Signed)
Ok to fill 

## 2015-07-09 NOTE — Telephone Encounter (Signed)
Approved: okay to fill #180 x 0 but She is just starting this so I didn't think we should give so much till we knew how she reacted and whether she needed a higher dose. She should probably start with #60 x 0--then when dose is set we can do 3 months

## 2015-07-10 DIAGNOSIS — R55 Syncope and collapse: Secondary | ICD-10-CM | POA: Insufficient documentation

## 2015-07-11 ENCOUNTER — Other Ambulatory Visit: Payer: Self-pay | Admitting: Internal Medicine

## 2015-07-11 NOTE — Telephone Encounter (Signed)
Called in 06/27/2015

## 2015-07-11 NOTE — Telephone Encounter (Signed)
Check with her and pharmacy

## 2015-07-12 NOTE — Telephone Encounter (Signed)
.  left message to have patient return my call.  

## 2015-07-12 NOTE — Telephone Encounter (Signed)
Patient returned Dee's call.  Please call patient back at [phone].

## 2015-07-12 NOTE — Telephone Encounter (Signed)
Spoke with patient and advised results  Per pt the Seroquel is giving her bad dreams, she's dreaming about all the stress, she will continue to take 1 per day and not increase to 2 just yet until she hear back from Dr. Alphonsus Sias. Patient states that she has maybe 13 clonopin left and that she takes the most 4 per day and wanted to know what to do about the panic/anxiety attacks.  rx called into pharmacy

## 2015-07-12 NOTE — Telephone Encounter (Signed)
Spoke with pharmacist at [facility] and Becky Mata picked up #60 on 06/27/2015.  Spoke with patient and she states she's been taking between 4-6 per day because of her nervous stomach, anxiety and panic attacks. Becky Mata stated Dr. Alphonsus Sias knew she was taking so many. Please advise

## 2015-07-12 NOTE — Telephone Encounter (Signed)
No, I didn't know she was taking so many and it is not appropriate Okay to fill emergency supply #15 x 0 This needs to last till ~10/15 (so at most 1 a day to avoid withdrawal)

## 2015-07-15 ENCOUNTER — Ambulatory Visit: Payer: PRIVATE HEALTH INSURANCE | Admitting: Internal Medicine

## 2015-07-23 ENCOUNTER — Encounter: Payer: Self-pay | Admitting: Internal Medicine

## 2015-07-23 ENCOUNTER — Ambulatory Visit (INDEPENDENT_AMBULATORY_CARE_PROVIDER_SITE_OTHER): Payer: PRIVATE HEALTH INSURANCE | Admitting: Licensed Clinical Social Worker

## 2015-07-23 ENCOUNTER — Ambulatory Visit (INDEPENDENT_AMBULATORY_CARE_PROVIDER_SITE_OTHER): Payer: PRIVATE HEALTH INSURANCE | Admitting: Internal Medicine

## 2015-07-23 VITALS — BP 100/60 | HR 92 | Temp 98.6°F | Wt 136.0 lb

## 2015-07-23 DIAGNOSIS — F3170 Bipolar disorder, currently in remission, most recent episode unspecified: Secondary | ICD-10-CM

## 2015-07-23 MED ORDER — QUETIAPINE FUMARATE 50 MG PO TABS: 50.0000 mg | ORAL_TABLET | Freq: Every day | ORAL | Status: DC

## 2015-07-23 NOTE — Progress Notes (Signed)
Patient:   Becky Mata   DOB:   1981-07-24  MR Number:  [id number]  Location:  [facility] [address] [place] [address] Dept: [phone]           Date of Service:   07/23/2015  Start Time:   11a End Time:   12p  Provider/Observer:  Marinda Elk Counselor       Billing Code/Service: [phone]  Behavioral Observation: Becky Mata  presents as a 34 y.o.-year-old Caucasian Female who appeared her stated age. her dress was Appropriate and she was Casual and her manners were Appropriate to the situation.  There were not any physical disabilities noted.  she displayed an appropriate level of cooperation and motivation.    Interactions:    Active   Attention:   within normal limits  Memory:   within normal limits  Speech (Volume):  normal  Speech:   normal volume  Thought Process:  Coherent  Though Content:  WNL  Orientation:   person, place, time/date and situation  Judgment:   Good  Planning:   Good  Affect:    Appropriate  Mood:    Anxious  Insight:   Good  Intelligence:   normal  Chief Complaint:     Chief Complaint  Patient presents with  . Establish Care    Reason for Service:  "I want to be able to talk to someone, medicine to be fixed."  Current Symptoms:  Fainted while at work on September 13, bipolar, panic attack arrived back at the hospital on  Sept 26, Teeth chattering, upset stomach, arm and leg movement uncontrollable, pacing, overwhelmed, mood swings, did not sleep Sunday night and was able to function Monday    Source of Distress:              Marriage, Children, work, pain  Marital Status/Living: Married/lives with her husband and 62 year old daughter  Employment History: Physical Therapist  Education:   Buyer, retail; Attended [employer]; Graduated in 2005; high school was "good" In the 8th grade her desk was placed  outside the classroom, talkative, talk back  Legal History:  Denies  Hotel manager Experience:  Denies   Religious/Spiritual Preferences:  "I believe what I want to believe"  Family/Childhood History:                           Born in [place]; has one two sisters describes childhood as "called Sgt. 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  Smokes about 1 -2 puffs daily but has not used since Jul 06 2015; began at age 67.  Reports that she smokes to eat at night and to relax   Medical History:  No past medical history on file.        Medication List       This list is accurate as of: 07/23/15 11:25 AM.  Always use your most recent med list.  clonazePAM 0.5 MG tablet  Commonly known as:  KLONOPIN  TAKE 1 TABLET BY MOUTH TWICE DAILY AS NEEDED     DULoxetine 30 MG capsule  Commonly known as:  CYMBALTA  Take 1 capsule (30 mg total) by mouth daily.     norethindrone-ethinyl estradiol 1-20 MG-MCG tablet  Commonly known as:  MICROGESTIN FE 1/20  Take 1 tablet by mouth daily.     nystatin cream  Commonly known as:  MYCOSTATIN  APPLY EXTERNALLY AS DIRECTED NIGHTLY FOR 14 DAYS     QUEtiapine 50 MG tablet  Commonly known as:  SEROQUEL  Take 1 tablet (50 mg total) by mouth at bedtime. Take 1 tab  BY MOUTH AT BEDTIME              Sexual History:   History  Sexual Activity  . Sexual Activity: Not on file     Abuse/Trauma History: Denies    Psychiatric History:  Denies   Strengths:   Hardworker, people person, compassion, go out of my way"anything I do"    Recovery Goals:  "I want to be able to talk to someone, medicine to be fixed."  Hobbies/Interests:               Ride horses, camping,  crafts   Challenges/Barriers: Mood, time management    Family Med/Psych History:  Family History  Problem Relation Age of Onset  . Arthritis Mother     rheumatoid arthritis  . Alcohol abuse Mother   . Hypertension Father   . Diabetes Maternal Grandmother   . Fibromyalgia Maternal Grandmother   . Heart disease Neg Hx     Risk of Suicide/Violence: low   History of Suicide/Violence:  Denies  Psychosis:   Denies  Diagnosis:    Bipolar Disorder   Impression/DX:  Becky Mata is currently married with a small child.  Becky Mata is current diagnosed with Bipolar Disorder.  She is currently experiencing the following symptoms Fainted while at work on September 13, bipolar, panic attack arrived back at the hospital on  Sept 26, Teeth chattering, upset stomach, arm and leg movement uncontrollable, pacing, overwhelmed, mood swings, did not sleep Sunday night and was able to function Monday.  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  Becky Mata will be best supported by medication management and outpatient therapy to assist with coping skills and understanding her triggers.  Becky Mata does not have current SI or HI.  She has protective factors.  Becky Mata has a few positive relationships with others and denies psychosis.   Recommendation/Plan: Writer recommends Outpatient Therapy at least twice monthly to include but not limited to individual, group and or family therapy.  Medication Management is also recommended to assist with her mood.

## 2015-07-23 NOTE — Progress Notes (Signed)
   Subjective:    Patient ID: Becky Mata, female    DOB: [birth year], 34 y.o.   MRN: [id number]  HPI Here for follow up of apparent bipolar disorder  Tried 1 of the quetiapine Had nightmares at first--then these improved to just vivid dreams Felt good for about a week  Tried 2 per my instructions--- got restless arms and sleep problems again It will affect her getting up in the morning-- if she takes 2 of them  Has had some crying spells "I am crazy" Not getting upset as often--but seems to be harder to calm herself (her subjective)  Her physical symptoms have calmed Has been able to work 4 days per week-- "and it is good" Husband has noticed a real overall improvement---but still having marital issues  cymbalta has really helped her pain issues Usually down to tramadol just once a day She will try cutting them in half (RLS though)  Current Outpatient Prescriptions on File Prior to Visit  Medication Sig Dispense Refill  . clonazePAM (KLONOPIN) 0.5 MG tablet TAKE 1 TABLET BY MOUTH TWICE DAILY AS NEEDED 15 tablet 0  . DULoxetine (CYMBALTA) 30 MG capsule Take 1 capsule (30 mg total) by mouth daily. 30 capsule 3  . norethindrone-ethinyl estradiol (MICROGESTIN FE 1/20) 1-20 MG-MCG tablet Take 1 tablet by mouth daily. 1 Package 11  . nystatin cream (MYCOSTATIN) APPLY EXTERNALLY AS DIRECTED NIGHTLY FOR 14 DAYS 60 g 0  . QUEtiapine (SEROQUEL) 50 MG tablet TAKE 1 TO 2 TABLETS(50 TO 100 MG) BY MOUTH AT BEDTIME 60 tablet 0   No current facility-administered medications on file prior to visit.    No Known Allergies  No past medical history on file.  Past Surgical History  Procedure Laterality Date  . Cesarean section  2012  . Colposcopy w/ biopsy / curettage  ~2009    Family History  Problem Relation Age of Onset  . Arthritis Mother     rheumatoid arthritis  . Alcohol abuse Mother   . Hypertension Father   . Diabetes Maternal Grandmother   . Fibromyalgia Maternal  Grandmother   . Heart disease Neg Hx     Social History   Social History  . Marital Status: Married    Spouse Name: N/A  . Number of Children: 1  . Years of Education: N/A   Occupational History  . PT aide [employer]   Social History Main Topics  . Smoking status: Never Smoker   . Smokeless tobacco: Never Used  . Alcohol Use: No  . Drug Use: No  . Sexual Activity: Not on file   Other Topics Concern  . Not on file   Social History Narrative   Review of Systems Did see the neurologist--very brief visit (he had flat tire and was late) She is driving, etc Weight is up 10#    Objective:   Physical Exam  Psychiatric:  Still somewhat pressured speech but smiling and seems more upbeat Not anxious  On topic with speech          Assessment & Plan:

## 2015-07-23 NOTE — Progress Notes (Signed)
Pre visit review using our clinic review tool, if applicable. No additional management support is needed unless otherwise documented below in the visit note. 

## 2015-07-23 NOTE — Assessment & Plan Note (Signed)
Doing better on the  quetiapine Didn't tolerate higher dose Back to work regularly, etc Will continue this and duloxetine

## 2015-07-25 ENCOUNTER — Ambulatory Visit: Payer: PRIVATE HEALTH INSURANCE | Admitting: Internal Medicine

## 2015-07-29 ENCOUNTER — Other Ambulatory Visit: Payer: Self-pay | Admitting: Internal Medicine

## 2015-07-29 NOTE — Telephone Encounter (Signed)
Approved: #60 x 0 

## 2015-07-29 NOTE — Telephone Encounter (Signed)
07/12/2015 last filled for #15 tabs

## 2015-07-29 NOTE — Telephone Encounter (Signed)
rx called into pharmacy

## 2015-08-06 ENCOUNTER — Ambulatory Visit: Payer: PRIVATE HEALTH INSURANCE | Admitting: Licensed Clinical Social Worker

## 2015-08-07 ENCOUNTER — Telehealth: Payer: Self-pay | Admitting: *Deleted

## 2015-08-07 MED ORDER — DULOXETINE HCL 60 MG PO CPEP: 60.0000 mg | ORAL_CAPSULE | Freq: Every day | ORAL | Status: DC

## 2015-08-07 NOTE — Telephone Encounter (Signed)
Spoke with patient and advised results   

## 2015-08-07 NOTE — Telephone Encounter (Signed)
Let her know I sent the new prescription in for her for the higher dose

## 2015-08-07 NOTE — Telephone Encounter (Signed)
Pt calling asking for refill on the increased dose of Cymbalta, pt states she increased per OV to 60mg  per day and her insurance will not fill because the dose was at 30 mg so she would like the increased dose sent to pharmacy, ok to fill 60mg  Cymbalta?

## 2015-08-20 ENCOUNTER — Encounter: Payer: Self-pay | Admitting: Psychiatry

## 2015-08-20 ENCOUNTER — Ambulatory Visit (INDEPENDENT_AMBULATORY_CARE_PROVIDER_SITE_OTHER): Payer: PRIVATE HEALTH INSURANCE | Admitting: Psychiatry

## 2015-08-20 ENCOUNTER — Telehealth: Payer: Self-pay

## 2015-08-20 VITALS — BP 126/68 | HR 94 | Temp 97.0°F | Ht 66.0 in | Wt 139.0 lb

## 2015-08-20 DIAGNOSIS — F3181 Bipolar II disorder: Secondary | ICD-10-CM

## 2015-08-20 MED ORDER — CLONAZEPAM 0.5 MG PO TABS: 0.5000 mg | ORAL_TABLET | Freq: Two times a day (BID) | ORAL | Status: DC | PRN

## 2015-08-20 MED ORDER — ARIPIPRAZOLE 10 MG PO TABS: 10.0000 mg | ORAL_TABLET | Freq: Every day | ORAL | Status: DC

## 2015-08-20 MED ORDER — DULOXETINE HCL 30 MG PO CPEP: 60.0000 mg | ORAL_CAPSULE | Freq: Two times a day (BID) | ORAL | Status: DC

## 2015-08-20 NOTE — Progress Notes (Signed)
Psychiatric Initial Adult Assessment   Patient Identification: Becky Mata MRN:  [id number] Date of Evaluation:  08/20/2015 Referral Source: PCP Chief Complaint:  "Things have been stressful between me and my husband" and I had and "episode at work" Chief Complaint    Establish Care; Other; Panic Attack; Anxiety     Visit Diagnosis:    ICD-9-CM ICD-10-CM   1. Bipolar 2 disorder (HCC) 296.89 F31.81    Diagnosis:   Patient Active Problem List   Diagnosis Date Noted  . Neurocardiogenic syncope [R55] 07/10/2015  . Syncope [R55] 06/27/2015  . 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 Bipolar affective disorder (HCC) [F31.9] 01/18/2013  . Hip pain [M25.559] 06/16/2011   History of Present Illness:  Patient indicates that she is typically had problems with anxiety, anger. She states that she also has had periods where she spelled hyper. However she attributes some of that to being simply her personality and states that it was probably at its worse prior to her marriage 10 years ago. 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 She did state that probably of the time she did do risky things. However states that has been less of an issue since she has been married.  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 She states that she can't really remember all the details. She states she does not remember being anxious at the time that she passed out. However she states that she was taken to the emergency room and they are she did experience anxiety, and upset stomach, feeling like her arms were contracting and her body was bawling up. She states she also had hip pain. Patient was started on Cymbalta 30 mg twice a day on 06/30/2015. She states that the Cymbalta is been very helpful for the pain.  Then on 07/08/2015 she had an episode that caused her to have another attack in which  her teeth were chattering, her stomach was nodded up. At that time the husband reportedly indicated she had mood swings that were from 1 minute to the next. ALSO discussion of her getting angry quickly and out of control. At that time her primary care physician put her on quetiapine 50 mg a day with a plan that after 3 days she should go up to 100 mg. Patient remained at Seroquel 50 mg because she said when she took the 100 she felt too sedated on it. He is able to state that the Seroquel probably has kept her mood a little bit more under control where she doesn't stay as angry about things as long. However she relates that she believes she is gaining weight on the Seroquel will.  Patient indicates that she's had some prior trials of medications such as Effexor but it made her sleep. She states Wellbutrin work for a while. She indicates she has also had medications from other sources such as getting them from a health care professional who had samples.  In regards to symptoms of depression the patient denies that she's ever been in a prolonged depressive mood. The only depressive symptoms she states she might have is a lack of motivation or drive. She denies ever having any suicidal ideation or any past suicide attempts. In regards to mania she does indicate that at times she's had the spending as discussed above. She states that she does feel  like her thoughts race at times. She indicates that she has had rapid speech and people have told her she has to slow down. He also describes she does feel hyperactive but she feels it is actually something that helps her be productive.  Elements:  Duration:  As noted above. Associated Signs/Symptoms: Depression Symptoms:  Decreased motivation and interest (Hypo) Manic Symptoms:  as noted above Anxiety Symptoms:  Excessive Worry, Panic Symptoms, Psychotic Symptoms:  None in the past or presently PTSD Symptoms: NA  Past Medical History:  Past Medical History   Diagnosis Date  . Anxiety   . Bipolar disorder (HCC)   . Personality disorder     Past Surgical History  Procedure Laterality Date  . Cesarean section  2012  . Colposcopy w/ biopsy / curettage  ~2009   Family History:  Family History  Problem Relation Age of Onset  . Arthritis Mother     rheumatoid arthritis  . Alcohol abuse Mother   . Hypothyroidism Mother   . Anxiety disorder Mother   . Depression Mother   . Hypertension Father   . Diabetes Maternal Grandmother   . Fibromyalgia Maternal Grandmother   . Heart disease Neg Hx   . Bipolar disorder Sister   . ADD / ADHD Sister   . Drug abuse Sister   . Kidney disease Sister    Social History:   Social History   Social History  . Marital Status: Married    Spouse Name: N/A  . Number of Children: 1  . Years of Education: N/A   Occupational History  . PT aide [employer]   Social History Main Topics  . Smoking status: Never Smoker   . Smokeless tobacco: Never Used  . Alcohol Use: No  . Drug Use: Yes    Special: Marijuana     Comment: daily used maybe 4 times a week- last used 2 nights ago  . Sexual Activity: Yes    Birth Control/ Protection: Pill   Other Topics Concern  . None   Social History Narrative   Additional Social History:  Musculoskeletal: Strength & Muscle Tone: within normal limits Gait & Station: normal Patient leans: N/A  Psychiatric Specialty Exam: HPI  Review of Systems  Psychiatric/Behavioral: Negative for depression, suicidal ideas, hallucinations, memory loss and substance abuse. The patient is nervous/anxious. The patient does not have insomnia.        Irritability, mood swings  All other systems reviewed and are negative.   Blood pressure 126/68, pulse 94, temperature 97 F (36.1 C), temperature source Tympanic, height  (1.676 m), weight 139 lb (63.05 kg), SpO2 95 %.Body mass index is 22.45 kg/(m^2).  General Appearance: Well Groomed  Eye Contact:  Good  Speech:  Pressured   Volume:  Normal at times somewhat elevated   Mood:  Good  Affect:  patient was mildly labile. She would laugh at times and other times becomes somewhat irritable such as when she expressed  in [place] medications would make her a "zombie."  Thought Process:  Circumstantial  Orientation:  Full (Time, Place, and Person)  Thought Content:  Negative  Suicidal Thoughts:  No  Homicidal Thoughts:  No  Memory:  Immediate;   Good Recent;   Good Remote;   Good  Judgement:  Fair  Insight:  Fair  Psychomotor Activity:  Slightly increased  Concentration:  Good  Recall:  Good  Fund of Knowledge:Good  Language: Good  Akathisia:  Negative  Handed:    AIMS (if  indicated):  Done 08/20/15 and normal  Assets:  Communication Skills Desire for Improvement Social Support Vocational/Educational  ADL's:  Intact  Cognition: WNL  Sleep:  good   Is the patient at risk to self?  No. Has the patient been a risk to self in the past 6 months?  No. Has the patient been a risk to self within the distant past?  No. Is the patient a risk to others?  No. Has the patient been a risk to others in the past 6 months?  No. Has the patient been a risk to others within the distant past?  No.  Allergies:  No Known Allergies Current Medications: Current Outpatient Prescriptions  Medication Sig Dispense Refill  . clonazePAM (KLONOPIN) 0.5 MG tablet Take 1 tablet (0.5 mg total) by mouth 2 (two) times daily as needed. 60 tablet 1  . DULoxetine (CYMBALTA) 30 MG capsule Take 2 capsules (60 mg total) by mouth 2 (two) times daily. 60 capsule 1  . norethindrone-ethinyl estradiol (MICROGESTIN FE 1/20) 1-20 MG-MCG tablet Take 1 tablet by mouth daily. 1 Package 11  . nystatin cream (MYCOSTATIN) APPLY EXTERNALLY AS DIRECTED NIGHTLY FOR 14 DAYS 60 g 0  . ARIPiprazole (ABILIFY) 10 MG tablet Take 1 tablet (10 mg total) by mouth daily. 30 tablet 1   No current facility-administered medications for this visit.    Previous  Psychotropic Medications: Yes  Past role of Wellbutrin which she states worked well in the past. She states she's been on Effexor but caused her to sleep. She presented on Seroquel 50 mg but reports too much sedation at a higher dose. She presents on clonazepam 0.5 mg and Cymbalta 60 mg once daily. Substance Abuse History in the last 12 months:  Yes.   Patient indicates that she does smoke marijuana daily 1-2 puffs at bedtime. She denies any use of other illicit drugs. She states that alcohol use may be at occasions but she denies any of the questions to the cage questionnaire. 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 Patient has symptoms of indiscretions, flight of ideas, irritability, increased speech. It does appear that she has some elements of hypomania although the duration of the symptoms may not reach the requirements for hypomania exactly. Patient certainly may fit more the criteria cyclothymia. It does appear that others have observed some benefit with her being on the Seroquel. However sedation issues limit our ability to increase that medication. Patient seemed to be ambivalent about any mood stabilizer. She stated she might be more interested in a stimulant for ADHD which would help her concentrate and focus. Discussed a trial of Abilify given that she is reporting sedation that even the lower doses of Seroquel. The she's been instructed to discontinue the Seroquel and start Abilify 10 mg daily. Risk and benefits of been discussing patient's able to consent. I did give her a laboratory slip for metabolic assessment. She states she has a primary care appointment next week.   Panic disorder  without agoraphobia-it does appear patient has had a panic attack. She is artery on Cymbalta which she states has been effective for pain. She states that when she takes 60 mg at once that it makes her too sleepy. Thus we  will rewrite her medication which was 60 mg once a day to 30 mg twice a day. Risk and benefits of this medication and been discussing patient's able consent.   In regards to risk assessment patient has risk factors of affective illness and race. She has protective factors of no past suicide attempts, minor children living in the home, social supports and employment. At this time low risk of imminent harm to herself or others.    Wallace Going 11/8/20164:08 PM

## 2015-08-20 NOTE — Telephone Encounter (Signed)
received a fax requesting a 90 day supply on aripiprazole 10mg  pt was seen today new pt. do you want to do a 90 day supply?

## 2015-08-26 NOTE — Telephone Encounter (Signed)
called pharmacy notified that we dont do 90 day supply for new patient. they have to be seen for 6 months with perfect attendance

## 2015-08-27 ENCOUNTER — Telehealth: Payer: Self-pay

## 2015-08-27 ENCOUNTER — Ambulatory Visit: Payer: PRIVATE HEALTH INSURANCE | Admitting: Internal Medicine

## 2015-08-27 NOTE — Telephone Encounter (Signed)
prior authorization requested for the duloxetine dr 30mg  / cymbalta 30mg 

## 2015-08-27 NOTE — Telephone Encounter (Signed)
this medication approved for coverage until 08-19-16

## 2015-09-17 ENCOUNTER — Telehealth: Payer: Self-pay | Admitting: Internal Medicine

## 2015-09-17 ENCOUNTER — Ambulatory Visit (INDEPENDENT_AMBULATORY_CARE_PROVIDER_SITE_OTHER): Payer: PRIVATE HEALTH INSURANCE | Admitting: Psychiatry

## 2015-09-17 ENCOUNTER — Encounter: Payer: Self-pay | Admitting: Psychiatry

## 2015-09-17 VITALS — BP 108/68 | HR 99 | Temp 98.2°F | Ht 66.0 in | Wt 141.6 lb

## 2015-09-17 DIAGNOSIS — F3181 Bipolar II disorder: Secondary | ICD-10-CM

## 2015-09-17 MED ORDER — QUETIAPINE FUMARATE 50 MG PO TABS: 50.0000 mg | ORAL_TABLET | Freq: Every day | ORAL | Status: DC

## 2015-09-17 MED ORDER — DULOXETINE HCL 30 MG PO CPEP: ORAL_CAPSULE | ORAL | Status: DC

## 2015-09-17 NOTE — Telephone Encounter (Signed)
Pt has appt 09/18/15 at 12:30 with Dr Alphonsus SiasLetvak.

## 2015-09-17 NOTE — Progress Notes (Addendum)
BH MD/PA/NP OP Progress Note  09/17/2015 3:59 PM Stefhanie Kachmar  MRN:  [id number]  Subjective:  A she'll returns for follow-up of her bipolar disorder type II. She indicates she feels like she is doing better. She indicates that she tried the Abilify but did not feel it was helping her as much as the Seroquel will that she was on when she presented to her initial appointment. We discontinue the Seroquel at that time because she was reporting sedation at 100 mg a day and that she had only been taking 50. However she stated she now in retrospect felt like the Seroquel was helping her mood and she returned to taking her Seroquel 50 mg daily. She states she and her husband notices that this medication has kept her stable and less irritable.  She feels like the Cymbalta is a good medication for her [place] and anxiety. She indicated that the 60 mg dose that she reported was initially making her sedated is no longer doing so. She states also helpful for her pain.  Patient inquired about ADHD as a cause for some of her symptoms. I did give her referral information for Elna Breslow, PhD does ADHD assessments.  Chief Complaint: Better Chief Complaint    Follow-up; Medication Refill     Visit Diagnosis:     ICD-9-CM ICD-10-CM   1. Bipolar 2 disorder (HCC) 296.89 F31.81     Past Medical History:  Past Medical History  Diagnosis Date  . Anxiety   . Bipolar disorder (HCC)   . Personality disorder     Past Surgical History  Procedure Laterality Date  . Cesarean section  2012  . Colposcopy w/ biopsy / curettage  ~2009   Family History:  Family History  Problem Relation Age of Onset  . Arthritis Mother     rheumatoid arthritis  . Alcohol abuse Mother   . Hypothyroidism Mother   . Anxiety disorder Mother   . Depression Mother   . Hypertension Father   . Diabetes Maternal Grandmother   . Fibromyalgia Maternal Grandmother   . Heart disease Neg Hx   . Bipolar disorder Sister   .  ADD / ADHD Sister   . Drug abuse Sister   . Kidney disease Sister    Social History:  Social History   Social History  . Marital Status: Married    Spouse Name: N/A  . Number of Children: 1  . Years of Education: N/A   Occupational History  . PT aide [employer]   Social History Main Topics  . Smoking status: Never Smoker   . Smokeless tobacco: Never Used  . Alcohol Use: No  . Drug Use: Yes    Special: Marijuana     Comment: last used about 2 weeks ago  . Sexual Activity: Yes    Birth Control/ Protection: Pill   Other Topics Concern  . None   Social History Narrative   Additional History:   Assessment:   Musculoskeletal: Strength & Muscle Tone: within normal limits Gait & Station: normal Patient leans: N/A  Psychiatric Specialty Exam: HPI  Review of Systems  Psychiatric/Behavioral: Negative for depression, suicidal ideas, hallucinations, memory loss and substance abuse. The patient is nervous/anxious. The patient does not have insomnia.   All other systems reviewed and are negative.   Blood pressure 108/68, pulse 99, temperature 98.2 F (36.8 C), temperature source Tympanic, height  (1.676 m), weight 141 lb 9.6 oz (64.229 kg), SpO2 98 %.Body mass index is  22.87 kg/(m^2).  General Appearance: Well Groomed  Eye Contact:  Good  Speech:  Rate much less pressured than her last visit  Volume:  Normal  Mood:  Better  Affect:  Congruent  Thought Process:  Linear  Orientation:  Full (Time, Place, and Person)  Thought Content:  Negative  Suicidal Thoughts:  No  Homicidal Thoughts:  No  Memory:  Immediate;   Good Recent;   Good Remote;   Good  Judgement:  Good  Insight:  Good  Psychomotor Activity:  Negative  Concentration:  Good  Recall:  Good  Fund of Knowledge: Good  Language: Good  Akathisia:  Negative  Handed:    AIMS (if indicated):  Done 08/20/15 and normal  Assets:  Communication Skills Desire for Improvement Social Support  ADL's:  Intact   Cognition: WNL  Sleep:  Good   Is the patient at risk to self?  No. Has the patient been a risk to self in the past 6 months?  No. Has the patient been a risk to self within the distant past?  No. Is the patient a risk to others?  No. Has the patient been a risk to others in the past 6 months?  No. Has the patient been a risk to others within the distant past?  No.  Current Medications: Current Outpatient Prescriptions  Medication Sig Dispense Refill  . clonazePAM (KLONOPIN) 0.5 MG tablet Take 1 tablet (0.5 mg total) by mouth 2 (two) times daily as needed. 60 tablet 1  . DULoxetine (CYMBALTA) 30 MG capsule Two tablets in the morning and one tablet int he afternoon. 90 capsule 1  . norethindrone-ethinyl estradiol (MICROGESTIN FE 1/20) 1-20 MG-MCG tablet Take 1 tablet by mouth daily. 1 Package 11  . nystatin cream (MYCOSTATIN) APPLY EXTERNALLY AS DIRECTED NIGHTLY FOR 14 DAYS 60 g 0  . QUEtiapine (SEROQUEL) 50 MG tablet Take 1 tablet (50 mg total) by mouth at bedtime. 30 tablet 3   No current facility-administered medications for this visit.    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 Patient discontinued the Abilify that we started the last appointment and resume taking her Seroquel 50 mg daily. She feels her mood is been relatively stable on this dose. Past efforts to increase it to 100 mg caused her to have excessive sedation. Thus at this time we will continue her on Seroquel 50 mg.  Panic disorder without agoraphobia-it does appear patient has had a panic attack. She has been taking Cymbalta 60 mg a day but inquires about having a higher dose to assist her with both her mood as well as pain. We will increase her dose from 60 mg  daily to 60 mg in the morning and 30 mg in the afternoon. Patient has Klonopin 0.5 mg twice a day as needed. She indicates she does not need any refills of this medication at this time.  She'll follow up in 1 month. Wallace Goinglton Taiyo Kozma 09/17/2015, 3:59 PM

## 2015-09-17 NOTE — Telephone Encounter (Signed)
Patient Name: Becky HeapsLINDSEY Dia DOB: Feb 18, 1981 Initial Comment Caller states last week she worked with a Consulting civil engineerstudent with Scabies. Little spot on forearm, feels bumpy raised. She noticed this in the last day. Nurse Assessment Nurse: Charna Elizabethrumbull, RN, Cathy Date/Time (Eastern Time): 09/17/2015 2:35:05 PM Confirm and document reason for call. If symptomatic, describe symptoms. ---Caller states that she was exposed to Scabies and developed a raised, rough area on her forearm about 2 days ago. No fever. Has the patient traveled out of the country within the last 30 days? ---No Does the patient have any new or worsening symptoms? ---Yes Will a triage be completed? ---Yes Related visit to physician within the last 2 weeks? ---No Does the PT have any chronic conditions? (i.e. diabetes, asthma, etc.) ---Yes List chronic conditions. ---Anxiety, Bipolar Did the patient indicate they were pregnant? ---No Is this a behavioral health or substance abuse call? ---No Guidelines Guideline Title Affirmed Question Affirmed Notes Scabies Exposure [1] Looks infected (spreading redness, pus) AND [2] no fever Final Disposition User See Physician within 24 Hours Trumbull, RN, Lynden Angathy Comments Scheduled for 12:30pm appointment tomorrow with Dr. Alphonsus SiasLetvak. Referrals REFERRED TO PCP OFFICE Disagree/Comply: Comply

## 2015-09-18 ENCOUNTER — Ambulatory Visit: Payer: Self-pay | Admitting: Internal Medicine

## 2015-09-18 NOTE — Telephone Encounter (Signed)
Okay, will evaluate then 

## 2015-09-19 ENCOUNTER — Telehealth: Payer: Self-pay | Admitting: Internal Medicine

## 2015-09-19 NOTE — Telephone Encounter (Signed)
Pt did not come in for their appt on 09/18/15 for acute visit. Please let me know if pt needs to be contacted immediately for follow up or no follow up needed. Best phone number to contact pt is [phone].

## 2015-09-20 NOTE — Telephone Encounter (Signed)
She is now seeing Dr Mayford KnifeWilliams --psychiatrist. Let her know that I will just go back to doing physicals and as needed appointments for her. No need to reschedule this if she is doing okay

## 2015-09-23 ENCOUNTER — Encounter: Payer: Self-pay | Admitting: Internal Medicine

## 2015-09-30 ENCOUNTER — Other Ambulatory Visit: Payer: Self-pay | Admitting: Internal Medicine

## 2015-10-09 NOTE — Progress Notes (Signed)
Refill- reorder 

## 2015-10-21 ENCOUNTER — Other Ambulatory Visit: Payer: Self-pay | Admitting: Internal Medicine

## 2015-10-23 ENCOUNTER — Other Ambulatory Visit: Payer: Self-pay

## 2015-10-23 NOTE — Telephone Encounter (Signed)
Pt left v/m; pt had been seeing Dr Mayford KnifeWilliams at behavioral health and Dr Mayford KnifeWilliams is leaving and pt request to take over filling all of pts meds. Pt request cb. [facility] Med list has seroquel # 30 x 3 on 09/17/15 by Dr Mayford KnifeWilliams but was listed as no print. Pt has been out of med for 4 days.Please advise. Pt has appt with Dr Alphonsus SiasLetvak set up for 11/05/15.

## 2015-10-24 ENCOUNTER — Other Ambulatory Visit: Payer: Self-pay | Admitting: Internal Medicine

## 2015-10-24 MED ORDER — QUETIAPINE FUMARATE 50 MG PO TABS: 50.0000 mg | ORAL_TABLET | Freq: Every day | ORAL | Status: DC

## 2015-10-24 NOTE — Telephone Encounter (Signed)
rx sent to pharmacy by e-script  

## 2015-10-24 NOTE — Telephone Encounter (Signed)
Approved: okay #30 x 0 and we will review at OV. I had originally started her on this

## 2015-11-05 ENCOUNTER — Ambulatory Visit (INDEPENDENT_AMBULATORY_CARE_PROVIDER_SITE_OTHER): Payer: PRIVATE HEALTH INSURANCE | Admitting: Internal Medicine

## 2015-11-05 ENCOUNTER — Encounter: Payer: Self-pay | Admitting: Internal Medicine

## 2015-11-05 VITALS — BP 130/80 | HR 93 | Temp 97.7°F | Wt 153.0 lb

## 2015-11-05 DIAGNOSIS — F310 Bipolar disorder, current episode hypomanic: Secondary | ICD-10-CM

## 2015-11-05 DIAGNOSIS — Z23 Encounter for immunization: Secondary | ICD-10-CM

## 2015-11-05 MED ORDER — DULOXETINE HCL 30 MG PO CPEP: ORAL_CAPSULE | ORAL | Status: DC

## 2015-11-05 MED ORDER — QUETIAPINE FUMARATE 50 MG PO TABS: 50.0000 mg | ORAL_TABLET | Freq: Every day | ORAL | Status: DC

## 2015-11-05 MED ORDER — FLUCONAZOLE 150 MG PO TABS: 150.0000 mg | ORAL_TABLET | Freq: Once | ORAL | Status: DC

## 2015-11-05 MED ORDER — TRIAMCINOLONE ACETONIDE 0.1 % EX CREA: 1.0000 "application " | TOPICAL_CREAM | Freq: Two times a day (BID) | CUTANEOUS | Status: DC | PRN

## 2015-11-05 NOTE — Assessment & Plan Note (Signed)
Doing well on quetiapine and duloxetine Will continue--I will prescribe See back in 4 months

## 2015-11-05 NOTE — Progress Notes (Signed)
   Subjective:    Patient ID: Becky Mata, female    DOB: [birth year], 35 y.o.   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 clonazePAM (KLONOPIN) 0.5 MG tablet Take 1 tablet (0.5 mg total) by mouth 2 (two) times daily as needed. 60 tablet 1  . DULoxetine (CYMBALTA) 30 MG capsule Two tablets in the morning and one tablet int he afternoon. 90 capsule 1  . norethindrone-ethinyl estradiol (MICROGESTIN FE 1/20) 1-20 MG-MCG tablet Take 1 tablet by mouth daily. 1 Package 11  . QUEtiapine (SEROQUEL) 50 MG tablet Take 1 tablet (50 mg total) by mouth at bedtime. 30 tablet 0   No current facility-administered medications on file prior to visit.    No Known Allergies  Past Medical History  Diagnosis Date  . Anxiety   . Bipolar disorder (HCC)   . Personality disorder     Past Surgical History  Procedure Laterality Date  . Cesarean section  2012  . Colposcopy w/ biopsy / curettage  ~2009    Family History  Problem Relation Age of Onset  . Arthritis Mother     rheumatoid arthritis  . Alcohol abuse Mother   . Hypothyroidism Mother   . Anxiety disorder Mother   . Depression Mother   . Hypertension Father   . Diabetes Maternal Grandmother   . Fibromyalgia Maternal Grandmother   . Heart disease Neg Hx   . Bipolar disorder Sister   . ADD / ADHD Sister   . Drug abuse Sister   . Kidney disease Sister     Social History   Social  History  . Marital Status: Married    Spouse Name: N/A  . Number of Children: 1  . Years of Education: N/A   Occupational History  . PT aide Becky Mata   Social History Main Topics  . Smoking status: Never Smoker   . Smokeless tobacco: Never Used  . Alcohol Use: No  . Drug Use: Yes    Special: Marijuana     Comment: last used about 2 weeks ago  . Sexual Activity: Yes    Birth Control/ Protection: Pill   Other Topics Concern  . Not on file   Social History Narrative   Review of Systems Appetite is big--has gained some weight Sleeps a long time--thinks it is best "after my alarm clark rings"    Objective:   Physical Exam  Psychiatric: She has a normal mood and affect. Her behavior is normal.  Normal mood and speech/appearance          Assessment & Plan:

## 2015-11-05 NOTE — Addendum Note (Signed)
Addended by: Sueanne Margarita on: 11/05/2015 10:08 AM   Modules accepted: Orders

## 2015-11-05 NOTE — Progress Notes (Signed)
Pre visit review using our clinic review tool, if applicable. No additional management support is needed unless otherwise documented below in the visit note. 

## 2015-11-12 ENCOUNTER — Other Ambulatory Visit: Payer: Self-pay | Admitting: Internal Medicine

## 2015-11-13 NOTE — Telephone Encounter (Signed)
rx called into pharmacy

## 2015-11-13 NOTE — Telephone Encounter (Signed)
08/20/2015 

## 2015-11-13 NOTE — Telephone Encounter (Signed)
Approved: #60 x 0 

## 2015-12-02 ENCOUNTER — Other Ambulatory Visit: Payer: Self-pay | Admitting: *Deleted

## 2015-12-02 MED ORDER — NORETHIN ACE-ETH ESTRAD-FE 1-20 MG-MCG PO TABS: 1.0000 | ORAL_TABLET | Freq: Every day | ORAL | Status: DC

## 2015-12-10 ENCOUNTER — Other Ambulatory Visit: Payer: Self-pay | Admitting: Internal Medicine

## 2015-12-10 NOTE — Telephone Encounter (Signed)
[id number]

## 2015-12-11 NOTE — Telephone Encounter (Signed)
rx called into pharmacy

## 2015-12-11 NOTE — Telephone Encounter (Signed)
Approved: #60 x 0 

## 2015-12-31 ENCOUNTER — Other Ambulatory Visit: Payer: Self-pay | Admitting: Internal Medicine

## 2015-12-31 NOTE — Telephone Encounter (Signed)
Spoke to patient. The Cymbalta was helping when she stopped the tramadol in September. But, within the last 3 weeks she has had more back pain than usual. She does not have any tramsol. Said she can come in if needed. Last tramadol rx was 04-26-15 #60

## 2015-12-31 NOTE — Telephone Encounter (Signed)
Approved: okay #30 x 0 If she is going to need more, she will need appt

## 2015-12-31 NOTE — Telephone Encounter (Signed)
Left refill on voice mail at pharmacy. [id number]

## 2016-01-14 ENCOUNTER — Other Ambulatory Visit: Payer: Self-pay | Admitting: Internal Medicine

## 2016-01-14 NOTE — Telephone Encounter (Signed)
Approved: #60 x 0 

## 2016-01-14 NOTE — Telephone Encounter (Signed)
Left refill on voice mail at pharmacy  

## 2016-01-14 NOTE — Telephone Encounter (Signed)
Last refill 12-11-15 Last OV 11-05-15 No new OV scheduled

## 2016-02-12 ENCOUNTER — Other Ambulatory Visit: Payer: Self-pay | Admitting: Internal Medicine

## 2016-02-12 NOTE — Telephone Encounter (Signed)
Clonazepam last filled 01-14-16 #60 Tramadol last filled 12-31-15 #60 Last OV 11-05-15 Next OV 03-11-16

## 2016-02-12 NOTE — Telephone Encounter (Signed)
Approved: #60 x 0 for each 

## 2016-02-12 NOTE — Telephone Encounter (Signed)
Left refill on voice mail at pharmacy  

## 2016-03-11 ENCOUNTER — Encounter: Payer: Self-pay | Admitting: Internal Medicine

## 2016-03-11 ENCOUNTER — Ambulatory Visit (INDEPENDENT_AMBULATORY_CARE_PROVIDER_SITE_OTHER): Payer: PRIVATE HEALTH INSURANCE | Admitting: Internal Medicine

## 2016-03-11 ENCOUNTER — Other Ambulatory Visit: Payer: Self-pay | Admitting: Internal Medicine

## 2016-03-11 VITALS — BP 94/70 | HR 75 | Temp 97.4°F | Wt 162.0 lb

## 2016-03-11 DIAGNOSIS — F310 Bipolar disorder, current episode hypomanic: Secondary | ICD-10-CM

## 2016-03-11 MED ORDER — QUETIAPINE FUMARATE 50 MG PO TABS: 50.0000 mg | ORAL_TABLET | Freq: Every day | ORAL | Status: DC

## 2016-03-11 NOTE — Telephone Encounter (Signed)
Approved: #60 x 0 for both 

## 2016-03-11 NOTE — Telephone Encounter (Signed)
Both last filled 02-12-16 Last OV 03-11-16 No Future OV

## 2016-03-11 NOTE — Progress Notes (Signed)
Pre visit review using our clinic review tool, if applicable. No additional management support is needed unless otherwise documented below in the visit note. 

## 2016-03-11 NOTE — Assessment & Plan Note (Signed)
seroquel helps as mood stabilizer but some sedation and weight gain Trouble getting up in AM--she thinks this may be escape, not side effect Stress dealing with impulsive, manipulative 89 year old Husband not particularly helpful--reluctant to help with daughter (like taking her to school, cleaning in house, etc) Can try decreasing to 25/50 every other day--but she wonders if she needs more Wants to try higher at times Discussed alternatives-- depakote, lithium

## 2016-03-11 NOTE — Progress Notes (Signed)
Subjective:    Patient ID: Becky Mata, female    DOB: [birth year], 35 y.o.   MRN: [id number]  HPI Here for follow up of bipolar disorder  Concerned due to "wanting to sleep all the time" Occasionally slept through alarm Concerned this was due to seroquel Stopped it a week ago to see what would happen No clear change---restarted last night (50mg ) due to irritability,etc returning It does help keep her level Doesn't think she can tolerate in AM Abilify made her more irritable Concerned about weight gain  Still having trouble keeping up at work--hard to do 5 days per week Gets "body fatigue" Taking cymbalta 30 bid (occasionally 60/30) Helps pain but doesn't take it away  Current Outpatient Prescriptions on File Prior to Visit  Medication Sig Dispense Refill  . clonazePAM (KLONOPIN) 0.5 MG tablet TAKE 1 TABLET BY MOUTH TWICE DAILY 60 tablet 0  . DULoxetine (CYMBALTA) 30 MG capsule Two tablets in the morning and one tablet int he afternoon. 90 capsule 11  . fluconazole (DIFLUCAN) 150 MG tablet Take 1 tablet (150 mg total) by mouth once. 2 tablet 1  . norethindrone-ethinyl estradiol (MICROGESTIN FE 1/20) 1-20 MG-MCG tablet Take 1 tablet by mouth daily. 1 Package 11  . QUEtiapine (SEROQUEL) 50 MG tablet Take 1 tablet (50 mg total) by mouth at bedtime. 30 tablet 11  . traMADol (ULTRAM) 50 MG tablet TAKE 1 TABLET BY MOUTH TWICE DAILY AS NEEDED 60 tablet 0  . triamcinolone cream (KENALOG) 0.1 % Apply 1 application topically 2 (two) times daily as needed. 30 g 0   No current facility-administered medications on file prior to visit.    No Known Allergies  Past Medical History  Diagnosis Date  . Anxiety   . Bipolar disorder (HCC)   . Personality disorder     Past Surgical History  Procedure Laterality Date  . Cesarean section  2012  . Colposcopy w/ biopsy / curettage  ~2009    Family History  Problem Relation Age of Onset  . Arthritis Mother     rheumatoid  arthritis  . Alcohol abuse Mother   . Hypothyroidism Mother   . Anxiety disorder Mother   . Depression Mother   . Hypertension Father   . Diabetes Maternal Grandmother   . Fibromyalgia Maternal Grandmother   . Heart disease Neg Hx   . Bipolar disorder Sister   . ADD / ADHD Sister   . Drug abuse Sister   . Kidney disease Sister     Social History   Social History  . Marital Status: Married    Spouse Name: N/A  . Number of Children: 1  . Years of Education: N/A   Occupational History  . PT aide [employer]   Social History Main Topics  . Smoking status: Never Smoker   . Smokeless tobacco: Never Used  . Alcohol Use: No  . Drug Use: Yes    Special: Marijuana     Comment: last used about 2 weeks ago  . Sexual Activity: Yes    Birth Control/ Protection: Pill   Other Topics Concern  . Not on file   Social History Narrative   Review of Systems  Weight is up 10# or so overall--though trying to eat carefully Sleeping too much     Objective:   Physical Exam  Constitutional: She appears well-developed and well-nourished. No distress.  Psychiatric:  Normal speech and appearance No thought process problems  Assessment & Plan:

## 2016-03-11 NOTE — Telephone Encounter (Signed)
Left refill on voice mail at pharmacy  

## 2016-04-07 ENCOUNTER — Other Ambulatory Visit: Payer: Self-pay | Admitting: Internal Medicine

## 2016-04-08 NOTE — Telephone Encounter (Signed)
Both last filled 03-11-16 #60 Last OV 03-11-16 No Future OV

## 2016-04-08 NOTE — Telephone Encounter (Signed)
Approved: #60 x 0 for each 

## 2016-04-08 NOTE — Telephone Encounter (Signed)
Left refill on voice mail at pharmacy  

## 2016-05-07 ENCOUNTER — Other Ambulatory Visit: Payer: Self-pay | Admitting: Internal Medicine

## 2016-05-07 NOTE — Telephone Encounter (Signed)
plz phone in. 

## 2016-05-07 NOTE — Telephone Encounter (Signed)
Both last filled 04-08-16 #60. Last OV 03-11-16 No Future OV. Forward to Dr Sharen Hones in Dr Karle Starch absence

## 2016-05-08 NOTE — Telephone Encounter (Signed)
Left refills on voice mail at pharmacy  

## 2016-06-02 ENCOUNTER — Other Ambulatory Visit: Payer: Self-pay | Admitting: Internal Medicine

## 2016-06-03 NOTE — Telephone Encounter (Signed)
Left refills on voice mail at pharmacy  

## 2016-06-03 NOTE — Telephone Encounter (Signed)
Approved: #60 x 0 for both Let her know that she is a bit early---she should not call for refills before 30 days from now.

## 2016-06-03 NOTE — Telephone Encounter (Signed)
Left detailed message on Pt vm per DPR about being early.

## 2016-06-03 NOTE — Telephone Encounter (Signed)
Both last filled 05-08-16 #60. Last OV 03-11-16 No Future OV

## 2016-07-06 ENCOUNTER — Other Ambulatory Visit: Payer: Self-pay | Admitting: Internal Medicine

## 2016-07-06 NOTE — Telephone Encounter (Signed)
Last Filled 06-03-16 #60 Last OV 03-11-16 No Future OV Forward to Dr Dayton MartesAron in Dr Karle StarchLetvak's absence

## 2016-07-06 NOTE — Telephone Encounter (Signed)
Left refill on voice mail at pharmacy  

## 2016-07-15 ENCOUNTER — Other Ambulatory Visit: Payer: Self-pay | Admitting: Internal Medicine

## 2016-07-15 NOTE — Telephone Encounter (Signed)
Approved: #60 x 0 

## 2016-07-15 NOTE — Telephone Encounter (Signed)
Last filled 06-04-16 #60 Per [place] at the pharmacy. Last OV 03-11-16 No Future OV

## 2016-07-15 NOTE — Telephone Encounter (Signed)
Left refill on voice mail at pharmacy  

## 2016-08-03 ENCOUNTER — Other Ambulatory Visit: Payer: Self-pay | Admitting: Internal Medicine

## 2016-08-03 NOTE — Telephone Encounter (Signed)
Left refill on voice mail at pharmacy  

## 2016-08-03 NOTE — Telephone Encounter (Signed)
Approved: #60 x 0 

## 2016-08-03 NOTE — Telephone Encounter (Signed)
Last filled 07-06-16 #60 Last OV 03-11-16 No Future OV

## 2016-08-08 ENCOUNTER — Other Ambulatory Visit: Payer: Self-pay | Admitting: Internal Medicine

## 2016-08-13 ENCOUNTER — Other Ambulatory Visit: Payer: Self-pay | Admitting: Internal Medicine

## 2016-08-17 ENCOUNTER — Other Ambulatory Visit: Payer: Self-pay | Admitting: Internal Medicine

## 2016-08-17 NOTE — Telephone Encounter (Signed)
Left refill on voice mail at pharmacy  

## 2016-08-17 NOTE — Telephone Encounter (Signed)
Last filled 07-15-16 #60 Last OV 03-11-16 No Future OV

## 2016-08-17 NOTE — Telephone Encounter (Signed)
Approved: #60 x 0 

## 2016-08-29 IMAGING — CT CT HEAD W/O CM
2 series · 14 of 30 positions shown, 16 images · non-contrast
Comparison: None.

CLINICAL DATA: 34-year-old who fell to the floor from a standing
position, experienced loss of consciousness, and possible
tonic-clonic seizures.

EXAM:
CT HEAD WITHOUT CONTRAST
TECHNIQUE: Contiguous axial images were obtained from the base of the skull
through the vertex without intravenous contrast.

[Series 2: head wo · axial · 0.42mm/px · z∈[-91,+22]mm · 6 of 36 slices shown, 8 images]
[im 6/36  brain]
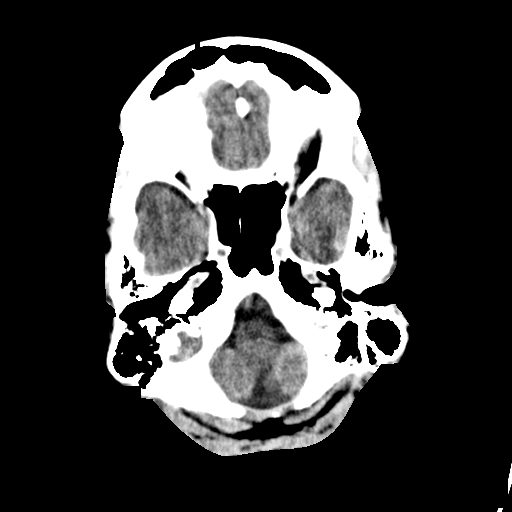
[im 6/36  bone]
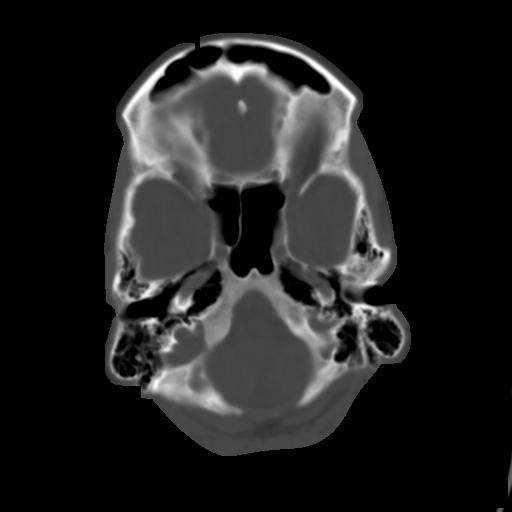
[im 11/36  brain]
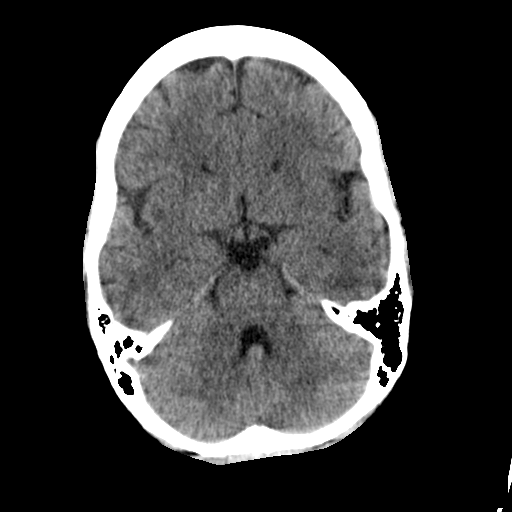
[im 16/36  brain]
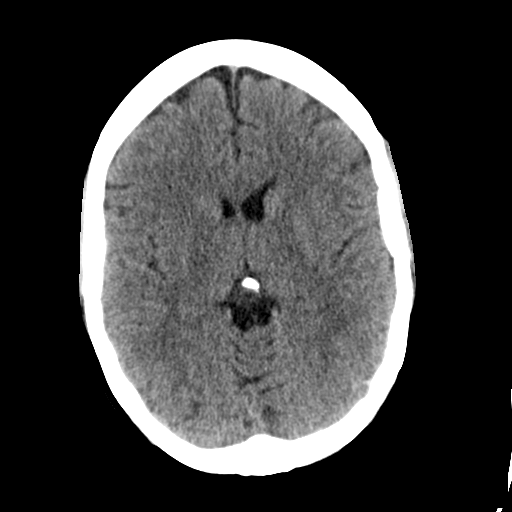
[im 21/36  brain]
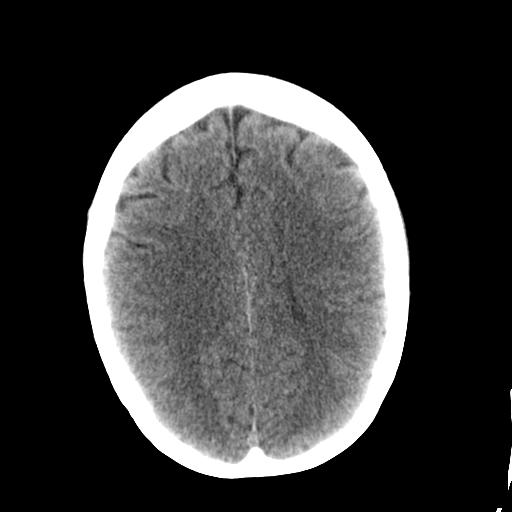
[im 26/36  brain]
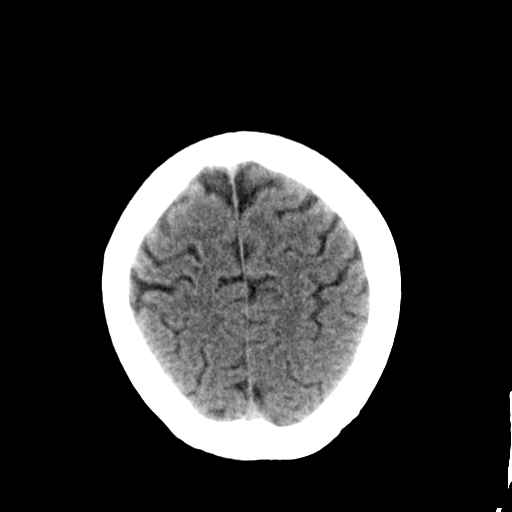
[im 26/36  bone]
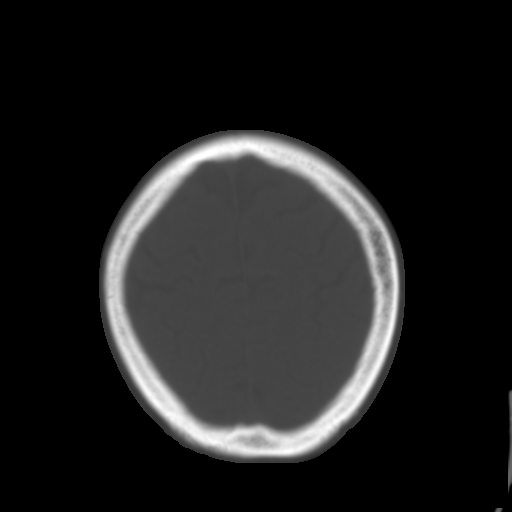
[im 31/36  brain]
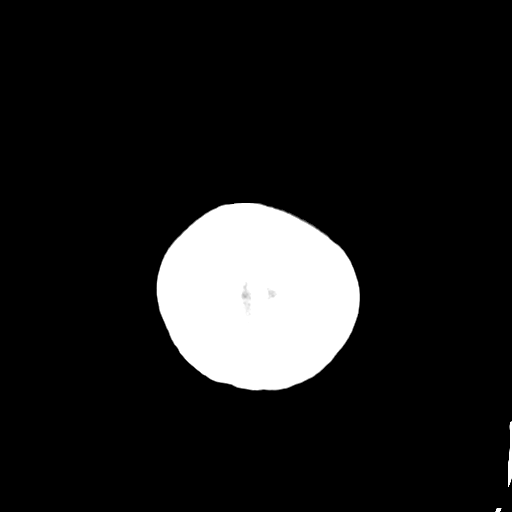

[Series 3: head bone · axial · 0.42mm/px · z∈[-100,+29]mm · 8 of 108 slices shown]
[im 11/108  bone]
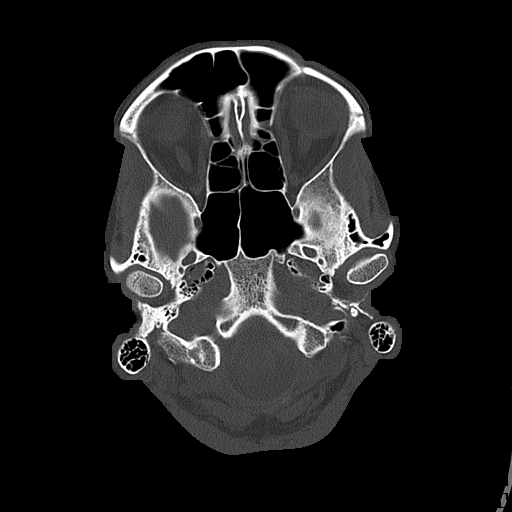
[im 21/108  bone]
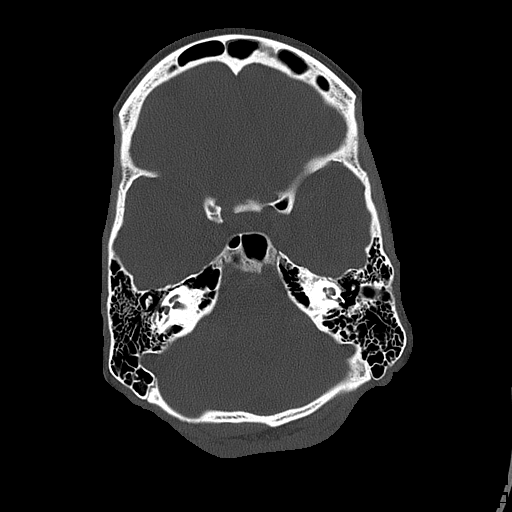
[im 36/108  bone]
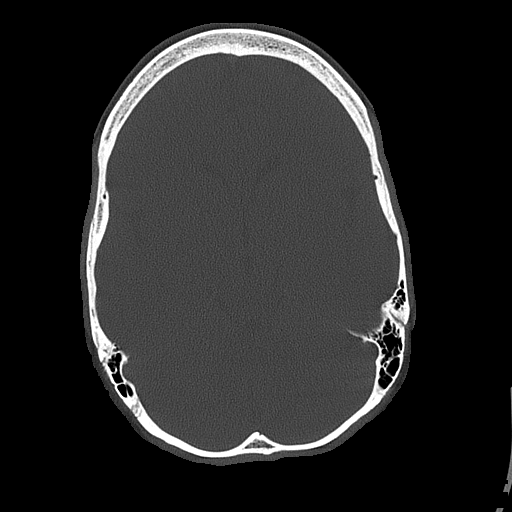
[im 46/108  bone]
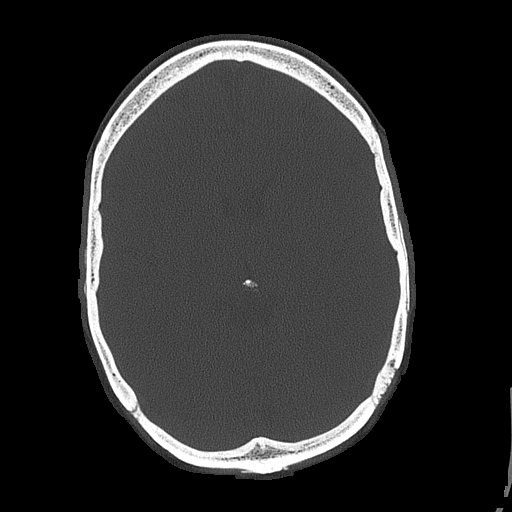
[im 62/108  bone]
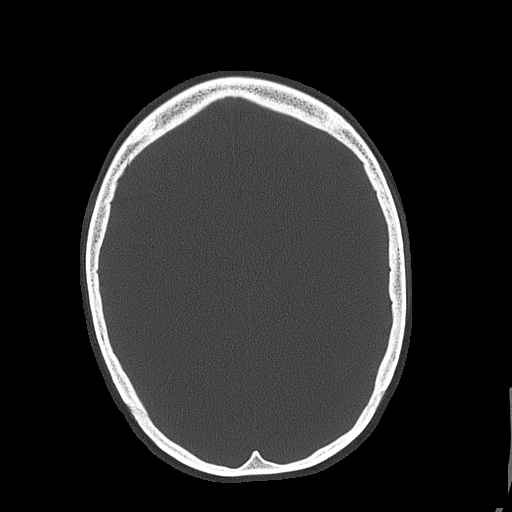
[im 72/108  bone]
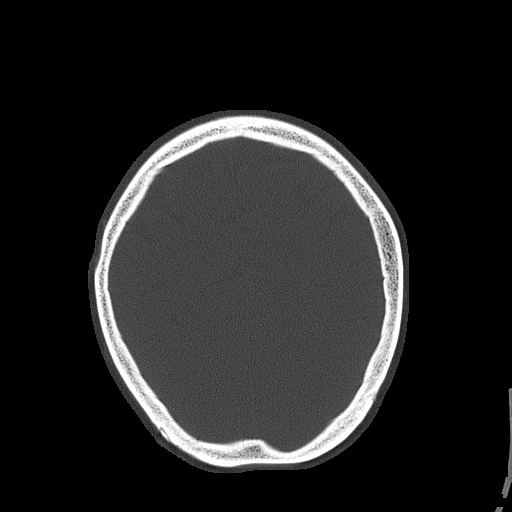
[im 87/108  bone]
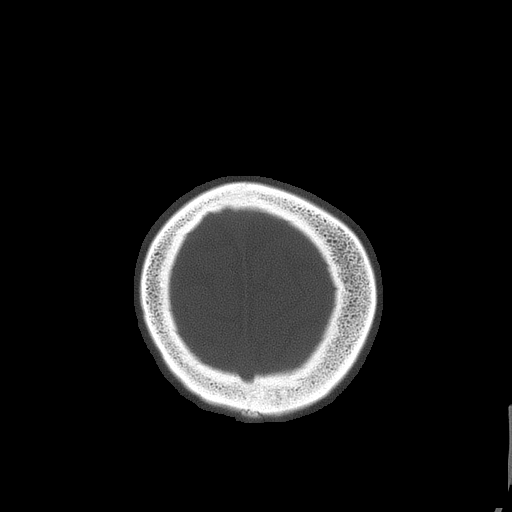
[im 97/108  bone]
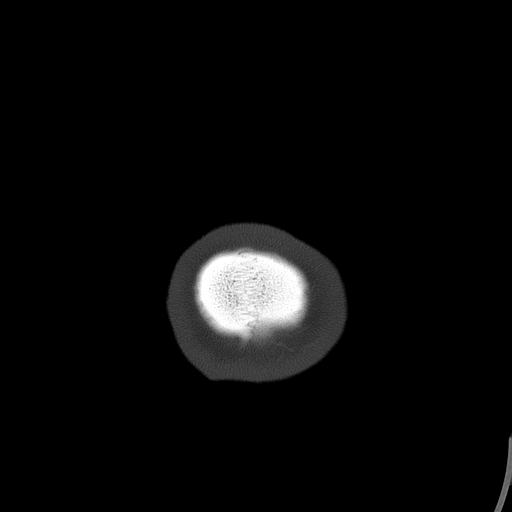

[14 of 30 positions shown; findings below may reference images not displayed]

FINDINGS: Ventricular system normal in size and appearance for age. No mass
lesion. No midline shift. No acute hemorrhage or hematoma. No
extra-axial fluid collections. No evidence of acute infarction. No
focal brain parenchymal abnormalities.

No focal osseous abnormalities involving the skull. Opacification of
scattered bilateral ethmoid air cells. Mild mucosal thickening
involving the left frontal sinus. Remaining visualized paranasal
sinuses, bilateral mastoid air cells, and bilateral middle ear
cavities well-aerated.
IMPRESSION: 1. Normal intracranially.
2. Mild chronic bilateral ethmoid and left frontal sinus disease.

## 2016-08-31 ENCOUNTER — Other Ambulatory Visit: Payer: Self-pay | Admitting: Internal Medicine

## 2016-09-01 NOTE — Telephone Encounter (Signed)
Left refill on voice mail at pharmacy  

## 2016-09-01 NOTE — Telephone Encounter (Signed)
Last filled 08-03-16 #60 Last OV 03-11-16 No Future OV

## 2016-09-01 NOTE — Telephone Encounter (Signed)
Approved: #60 x 0 

## 2016-09-14 ENCOUNTER — Other Ambulatory Visit: Payer: Self-pay | Admitting: Internal Medicine

## 2016-09-14 NOTE — Telephone Encounter (Signed)
Approved: #60 x 0 

## 2016-09-14 NOTE — Telephone Encounter (Signed)
Last office visit 03/11/2016.  Last refilled 08/17/2016 for #60 with no refills.

## 2016-09-14 NOTE — Telephone Encounter (Signed)
Clonazepam called into [phone] [place] - [address] AT [facility] OF [address] [facility] Phone: [phone]

## 2016-09-24 ENCOUNTER — Other Ambulatory Visit: Payer: Self-pay | Admitting: Internal Medicine

## 2016-09-28 ENCOUNTER — Other Ambulatory Visit: Payer: Self-pay

## 2016-09-28 MED ORDER — TRAMADOL HCL 50 MG PO TABS: 50.0000 mg | ORAL_TABLET | Freq: Two times a day (BID) | ORAL | 0 refills | Status: DC | PRN

## 2016-09-28 NOTE — Telephone Encounter (Signed)
Left refill on voice mail at pharmacy  

## 2016-09-28 NOTE — Telephone Encounter (Signed)
Approved: #60 x 0 

## 2016-09-28 NOTE — Telephone Encounter (Signed)
Pt came in with her daughter. She is asking for a refill of her Tramadol. It was last filled 09-01-16 #60. I advised her it was early. 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 Asking if we will go ahead and do the refill.

## 2016-10-09 ENCOUNTER — Other Ambulatory Visit: Payer: Self-pay | Admitting: Internal Medicine

## 2016-10-09 NOTE — Telephone Encounter (Signed)
Clonazepam last filled 09-14-16 #60 Last OV 03-11-16 No Future OV

## 2016-10-09 NOTE — Telephone Encounter (Signed)
Approved:duloxetine for 6months #60 x 0 for clonazepam Have her set up appt within the next few months

## 2016-10-27 ENCOUNTER — Other Ambulatory Visit: Payer: Self-pay | Admitting: Internal Medicine

## 2016-10-27 NOTE — Telephone Encounter (Signed)
Approved: #60 x 0 

## 2016-10-27 NOTE — Telephone Encounter (Signed)
Left refill on voice mail at pharmacy  

## 2016-10-27 NOTE — Telephone Encounter (Signed)
Last filled 09-28-16 #60 Last OV 03-11-16  Left VM for pt to call office set up appt

## 2016-11-05 ENCOUNTER — Other Ambulatory Visit: Payer: Self-pay | Admitting: Internal Medicine

## 2016-11-09 ENCOUNTER — Other Ambulatory Visit: Payer: Self-pay | Admitting: Internal Medicine

## 2016-11-09 NOTE — Telephone Encounter (Signed)
Approved: #60 x 0 

## 2016-11-09 NOTE — Telephone Encounter (Signed)
Left refill on voice mail at pharmacy  

## 2016-11-09 NOTE — Telephone Encounter (Signed)
Last filled 10-14-16 #60 Last OV 03-11-16 No future OV

## 2016-11-24 ENCOUNTER — Other Ambulatory Visit: Payer: Self-pay | Admitting: Internal Medicine

## 2016-11-24 NOTE — Telephone Encounter (Signed)
Tramadol last filled 10-27-16 #60 Last OV 03-11-16 No future OV

## 2016-11-25 NOTE — Telephone Encounter (Signed)
Tramadol refill left on VM at pharmacy.  Spoke to pt. She will make an appt

## 2016-11-25 NOTE — Telephone Encounter (Signed)
Approved: #60 x 0 Have her set up appt in the next few months. If she has insurance, a physical would be a good idea.

## 2016-12-04 ENCOUNTER — Encounter: Payer: Self-pay | Admitting: Internal Medicine

## 2016-12-04 ENCOUNTER — Ambulatory Visit (INDEPENDENT_AMBULATORY_CARE_PROVIDER_SITE_OTHER): Payer: BLUE CROSS/BLUE SHIELD | Admitting: Internal Medicine

## 2016-12-04 DIAGNOSIS — M797 Fibromyalgia: Secondary | ICD-10-CM | POA: Diagnosis not present

## 2016-12-04 DIAGNOSIS — Z0000 Encounter for general adult medical examination without abnormal findings: Secondary | ICD-10-CM | POA: Insufficient documentation

## 2016-12-04 DIAGNOSIS — F310 Bipolar disorder, current episode hypomanic: Secondary | ICD-10-CM

## 2016-12-04 MED ORDER — TRIAMCINOLONE ACETONIDE 0.1 % EX CREA: 1.0000 "application " | TOPICAL_CREAM | Freq: Two times a day (BID) | CUTANEOUS | 2 refills | Status: DC | PRN

## 2016-12-04 NOTE — Assessment & Plan Note (Signed)
Doing well on the seroquel  Duloxetine more for pain Concerned about the weight gain--not fasting so will check labs soon

## 2016-12-04 NOTE — Progress Notes (Signed)
Subjective:    Patient ID: Becky Mata, female    DOB: [birth year], 36 y.o.   MRN: [id number]  HPI Here for physical Didn't fast  Her bipolar issues seem to be controlled Not irritable and agitated on the seroquel Disturbed by the ongoing weight gain Not exercising  Ongoing pain --hips and other spots The duloxetine helps the tight feeling in pelvis Ongoing constant ache 2 tramadol in AM helps her get through her day  Needs new gyn Last pap ~3 years ago. 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 clonazePAM (KLONOPIN) 0.5 MG tablet TAKE 1 TABLET BY MOUTH TWICE DAILY AS NEEDED 60 tablet 0  . DULoxetine (CYMBALTA) 30 MG capsule TAKE 2 CAPSULES BY MOUTH EVERY MORNING AND 1 CAPSULE IN THE AFTERNOON 90 capsule 1  . fluconazole (DIFLUCAN) 150 MG tablet Take 1 tablet (150 mg total) by mouth once. 2 tablet 1  . MICROGESTIN FE 1/20 1-20 MG-MCG tablet TAKE 1 TABLET BY MOUTH DAILY 28 tablet 1  . QUEtiapine (SEROQUEL) 50 MG tablet Take 1-2 tablets (50-100 mg total) by mouth at bedtime. 60 tablet 5  . traMADol (ULTRAM) 50 MG tablet TAKE 1 TABLET BY MOUTH TWICE DAILY AS NEEDED 60 tablet 0  . triamcinolone cream (KENALOG) 0.1 % Apply 1 application topically 2 (two) times daily as needed. 30 g 0   No current facility-administered medications on file prior to visit.     No Known Allergies  Past Medical History:  Diagnosis Date  . Anxiety   . Bipolar disorder (HCC)   . Personality disorder     Past Surgical History:  Procedure Laterality Date  . CESAREAN SECTION  2012  . COLPOSCOPY W/ BIOPSY / CURETTAGE  ~2009    Family History  Problem Relation Age of Onset  . Arthritis Mother     rheumatoid arthritis  . Alcohol abuse Mother   . Hypothyroidism Mother   . Anxiety disorder Mother   .  Depression Mother   . Hypertension Father   . Diabetes Maternal Grandmother   . Fibromyalgia Maternal Grandmother   . Heart disease Neg Hx   . Bipolar disorder Sister   . ADD / ADHD Sister   . Drug abuse Sister   . Kidney disease Sister     Social History   Social History  . Marital status: Married    Spouse name: N/A  . Number of children: 1  . Years of education: N/A   Occupational History  . PT aide [employer]   Social History Main Topics  . Smoking status: Never Smoker  . Smokeless tobacco: Never Used  . Alcohol use No  . Drug use: Yes    Types: Marijuana     Comment: last used about 2 weeks ago  . Sexual activity: Yes    Birth control/ protection: Pill   Other Topics Concern  . Not on file   Social History Narrative  . No narrative on file   Review of Systems  Constitutional: Positive for fatigue.       Wears seat belt  HENT: Negative for hearing loss and tinnitus.        Tries to keep up with dentist  Eyes: Negative for visual disturbance.       No diplopia or unilateral vision loss  Respiratory: Negative for cough, chest tightness and shortness of breath.   Cardiovascular: Negative for chest pain, palpitations and leg swelling.  Gastrointestinal: Negative for blood in stool, constipation and nausea.       No heartburn   Endocrine: Negative for polydipsia and polyuria.  Genitourinary: Negative for dyspareunia, dysuria, frequency and urgency.       No periods lately--until that last 3 months (now getting at end of active pills)  Musculoskeletal: Positive for arthralgias and myalgias.  Skin:       Slight facial rash  Allergic/Immunologic: Negative for environmental allergies and immunocompromised state.  Neurological: Positive for headaches. Negative for dizziness, syncope and light-headedness.  Hematological: Negative for adenopathy. Does not bruise/bleed easily.  Psychiatric/Behavioral: Positive for decreased concentration.       Asks for something  for her attention--discussed my discomfort with this Sleeps "too much" at times       Objective:   Physical Exam  Constitutional: She is oriented to person, place, and time. She appears well-developed and well-nourished. No distress.  HENT:  Head: Normocephalic and atraumatic.  Right Ear: External ear normal.  Left Ear: External ear normal.  Mouth/Throat: Oropharynx is clear and moist. No oropharyngeal exudate.  Eyes: Conjunctivae are normal. Pupils are equal, round, and reactive to light.  Neck: Normal range of motion. Neck supple. No thyromegaly present.  Cardiovascular: Normal rate, regular rhythm, normal heart sounds and intact distal pulses.  Exam reveals no gallop.   No murmur heard. Pulmonary/Chest: Effort normal and breath sounds normal. No respiratory distress. She has no wheezes. She has no rales.  Abdominal: Soft. There is no tenderness.  Musculoskeletal: She exhibits no edema or tenderness.  Lymphadenopathy:    She has no cervical adenopathy.  Neurological: She is alert and oriented to person, place, and time.  Skin: No rash noted. No erythema.  Psychiatric: She has a normal mood and affect. Her behavior is normal.          Assessment & Plan:

## 2016-12-04 NOTE — Addendum Note (Signed)
Addended by: Alvina ChouWALSH, TERRI J on: 12/04/2016 11:18 AM   Modules accepted: Orders

## 2016-12-04 NOTE — Assessment & Plan Note (Signed)
Healthy Probably not due for pap yet---she will get copy of last one DASH info and discussed exercise

## 2016-12-04 NOTE — Progress Notes (Signed)
Pre visit review using our clinic review tool, if applicable. No additional management support is needed unless otherwise documented below in the visit note. 

## 2016-12-04 NOTE — Patient Instructions (Addendum)
Look for "free foods"---like listed on Weight Watchers site. Please get a copy of your last pap smear for me.  DASH Eating Plan DASH stands for "Dietary Approaches to Stop Hypertension." The DASH eating plan is a healthy eating plan that has been shown to reduce high blood pressure (hypertension). Additional health benefits may include reducing the risk of type 2 diabetes mellitus, heart disease, and stroke. The DASH eating plan may also help with weight loss. What do I need to know about the DASH eating plan? For the DASH eating plan, you will follow these general guidelines:  Choose foods with less than 150 milligrams of sodium per serving (as listed on the food label).  Use salt-free seasonings or herbs instead of table salt or sea salt.  Check with your health care provider or pharmacist before using salt substitutes.  Eat lower-sodium products. These are often labeled as "low-sodium" or "no salt added."  Eat fresh foods. Avoid eating a lot of canned foods.  Eat more vegetables, fruits, and low-fat dairy products.  Choose whole grains. Look for the word "whole" as the first word in the ingredient list.  Choose fish and skinless chicken or [place]key more often than red meat. Limit fish, poultry, and meat to 6 oz (170 g) each day.  Limit sweets, desserts, sugars, and sugary drinks.  Choose heart-healthy fats.  Eat more home-cooked food and less restaurant, buffet, and fast food.  Limit fried foods.  Do not fry foods. Cook foods using methods such as baking, boiling, grilling, and broiling instead.  When eating at a restaurant, ask that your food be prepared with less salt, or no salt if possible. What foods can I eat? Seek help from a dietitian for individual calorie needs. Grains  Whole grain or whole wheat bread. Brown rice. Whole grain or whole wheat pasta. Quinoa, bulgur, and whole grain cereals. Low-sodium cereals. Corn or whole wheat flour tortillas. Whole grain cornbread.  Whole grain crackers. Low-sodium crackers. Vegetables  Fresh or frozen vegetables (raw, steamed, roasted, or grilled). Low-sodium or reduced-sodium tomato and vegetable juices. Low-sodium or reduced-sodium tomato sauce and paste. Low-sodium or reduced-sodium canned vegetables. Fruits  All fresh, canned (in natural juice), or frozen fruits. Meat and Other Protein Products  Ground beef (85% or leaner), grass-fed beef, or beef trimmed of fat. Skinless chicken or [place]key. Ground chicken or [place]key. Pork trimmed of fat. All fish and seafood. Eggs. Dried beans, peas, or lentils. Unsalted nuts and seeds. Unsalted canned beans. Dairy  Low-fat dairy products, such as skim or 1% milk, 2% or reduced-fat cheeses, low-fat ricotta or cottage cheese, or plain low-fat yogurt. Low-sodium or reduced-sodium cheeses. Fats and Oils  Tub margarines without trans fats. Light or reduced-fat mayonnaise and salad dressings (reduced sodium). Avocado. Safflower, olive, or canola oils. Natural peanut or almond butter. Other  Unsalted popcorn and pretzels. The items listed above may not be a complete list of recommended foods or beverages. Contact your dietitian for more options.  What foods are not recommended? Grains  White bread. White pasta. White rice. Refined cornbread. Bagels and croissants. Crackers that contain trans fat. Vegetables  Creamed or fried vegetables. Vegetables in a cheese sauce. Regular canned vegetables. Regular canned tomato sauce and paste. Regular tomato and vegetable juices. Fruits  Canned fruit in light or heavy syrup. Fruit juice. Meat and Other Protein Products  Fatty cuts of meat. Ribs, chicken wings, bacon, sausage, bologna, salami, chitterlings, fatback, hot dogs, bratwurst, and packaged luncheon meats. Salted nuts and seeds.  Canned beans with salt. Dairy  Whole or 2% milk, cream, half-and-half, and cream cheese. Whole-fat or sweetened yogurt. Full-fat cheeses or blue cheese. Nondairy  creamers and whipped toppings. Processed cheese, cheese spreads, or cheese curds. Condiments  Onion and garlic salt, seasoned salt, table salt, and sea salt. Canned and packaged gravies. Worcestershire sauce. Tartar sauce. Barbecue sauce. Teriyaki sauce. Soy sauce, including reduced sodium. Steak sauce. Fish sauce. Oyster sauce. Cocktail sauce. Horseradish. Ketchup and mustard. Meat flavorings and tenderizers. Bouillon cubes. Hot sauce. Tabasco sauce. Marinades. Taco seasonings. Relishes. Fats and Oils  Butter, stick margarine, lard, shortening, ghee, and bacon fat. Coconut, palm kernel, or palm oils. Regular salad dressings. Other  Pickles and olives. Salted popcorn and pretzels. The items listed above may not be a complete list of foods and beverages to avoid. Contact your dietitian for more information.  Where can I find more information? National Heart, Lung, and Blood Institute: travelstabloid.com This information is not intended to replace advice given to you by your health care provider. Make sure you discuss any questions you have with your health care provider. Document Released: 09/17/2011 Document Revised: 03/05/2016 Document Reviewed: 08/02/2013 Elsevier Interactive Patient Education  2017 [employer].

## 2016-12-04 NOTE — Assessment & Plan Note (Signed)
Duloxetine and daily tramadol help

## 2016-12-07 ENCOUNTER — Telehealth: Payer: Self-pay

## 2016-12-07 NOTE — Telephone Encounter (Signed)
Pt did not go for her drug screening after her appointment on 12-04-16. Per Dr Alphonsus SiasLetvak: We will not write her tramadol and he advised me to take it off of her list.

## 2016-12-10 ENCOUNTER — Other Ambulatory Visit: Payer: Self-pay | Admitting: Internal Medicine

## 2016-12-11 NOTE — Telephone Encounter (Signed)
Approved: #60 x 0 

## 2016-12-11 NOTE — Telephone Encounter (Signed)
Rx called in to requested pharmacy 

## 2016-12-11 NOTE — Telephone Encounter (Signed)
Last filled 11/09/2016 #60. Last OV 11/2016-CPE

## 2016-12-21 ENCOUNTER — Other Ambulatory Visit: Payer: Self-pay | Admitting: Internal Medicine

## 2016-12-22 NOTE — Telephone Encounter (Signed)
Pt called because tramadol was not refilled. Pt said when she was seen on 12/04/16 pt did not do drug test because she only had 10 mins to get to work so pt left. Pt said she would try to come by on Mon to do urine test but wants to know if tramadol could be sent to [facility]. Pt request cb.

## 2016-12-23 NOTE — Telephone Encounter (Signed)
I expected that---but her leaving without the test was a big concern

## 2016-12-23 NOTE — Telephone Encounter (Signed)
Lm on pts vm requesting a call back 

## 2016-12-23 NOTE — Telephone Encounter (Signed)
Spoke to pt and advised per Dr Alphonsus SiasLetvak. Pt was upset and d/c call

## 2016-12-23 NOTE — Telephone Encounter (Signed)
Not until she does the urine test. I was pretty clear about it in the office and she needed to do it that day, not just leave. The [place] law requires us to be extremely careful with these medications

## 2016-12-28 ENCOUNTER — Other Ambulatory Visit: Payer: BLUE CROSS/BLUE SHIELD

## 2016-12-28 DIAGNOSIS — F310 Bipolar disorder, current episode hypomanic: Secondary | ICD-10-CM

## 2016-12-28 DIAGNOSIS — M797 Fibromyalgia: Secondary | ICD-10-CM

## 2016-12-28 NOTE — Telephone Encounter (Signed)
Pt said she came this afternoon for lab testing and wants to know when results are back if someone will call her and let her know when [facility]

## 2016-12-28 NOTE — Telephone Encounter (Signed)
It has been taking several days for this. Even if negative, because she didn't do the test right away, I will probably have to repeat it unannounced to be sure

## 2016-12-29 ENCOUNTER — Other Ambulatory Visit: Payer: Self-pay | Admitting: Internal Medicine

## 2016-12-29 NOTE — Telephone Encounter (Signed)
Left detailed message on VM per DPR. 

## 2017-01-01 ENCOUNTER — Other Ambulatory Visit: Payer: Self-pay | Admitting: Internal Medicine

## 2017-01-02 LAB — TOXASSURE SELECT 13 (MW), URINE

## 2017-01-12 ENCOUNTER — Ambulatory Visit (INDEPENDENT_AMBULATORY_CARE_PROVIDER_SITE_OTHER)
Admission: RE | Admit: 2017-01-12 | Discharge: 2017-01-12 | Disposition: A | Discharge status: Home / Self Care | Payer: BLUE CROSS/BLUE SHIELD | Source: Ambulatory Visit | Attending: Internal Medicine | Admitting: Internal Medicine

## 2017-01-12 ENCOUNTER — Encounter: Payer: Self-pay | Admitting: Internal Medicine

## 2017-01-12 ENCOUNTER — Ambulatory Visit (INDEPENDENT_AMBULATORY_CARE_PROVIDER_SITE_OTHER): Payer: BLUE CROSS/BLUE SHIELD | Admitting: Internal Medicine

## 2017-01-12 VITALS — BP 108/70 | HR 76 | Temp 98.1°F | Wt 161.0 lb

## 2017-01-12 DIAGNOSIS — G894 Chronic pain syndrome: Secondary | ICD-10-CM

## 2017-01-12 DIAGNOSIS — G8929 Other chronic pain: Secondary | ICD-10-CM | POA: Insufficient documentation

## 2017-01-12 LAB — SEDIMENTATION RATE: Sed Rate: 4 mm/hr (ref 0–20)

## 2017-01-12 NOTE — Progress Notes (Signed)
Pre visit review using our clinic review tool, if applicable. No additional management support is needed unless otherwise documented below in the visit note. 

## 2017-01-12 NOTE — Progress Notes (Signed)
   Subjective:    Patient ID: Becky Mata, female    DOB: 1981/04/06, 37 y.o.   MRN: [id number]  HPI Here due to ongoing problems with her pain  She has been looking up fibromyalgia Doesn't think this is what she has Now saw something about sacroiliac dysfunction--thinks she has this Has tried various exercises and relaxation (for "tightness that just won't stretch out")  Discussed concerns about the urine testing She does state her concerns were due to marijuana (and I told her that I am not concerned about this)  Hard to even stand in shower without the tramadol May have some relief with the cymbalta  Current Outpatient Prescriptions on File Prior to Visit  Medication Sig Dispense Refill  . clonazePAM (KLONOPIN) 0.5 MG tablet TAKE 1 TABLET BY MOUTH TWICE DAILY AS NEEDED 60 tablet 0  . DULoxetine (CYMBALTA) 30 MG capsule TAKE 2 CAPSULES BY MOUTH EVERY MORNING AND 1 CAPSULE IN THE AFTERNOON 90 capsule 11  . MICROGESTIN FE 1/20 1-20 MG-MCG tablet TAKE 1 TABLET BY MOUTH DAILY 28 tablet 1  . triamcinolone cream (KENALOG) 0.1 % Apply 1 application topically 2 (two) times daily as needed. 30 g 2   No current facility-administered medications on file prior to visit.     No Known Allergies  Past Medical History:  Diagnosis Date  . Anxiety   . Bipolar disorder (HCC)   . Personality disorder     Past Surgical History:  Procedure Laterality Date  . CESAREAN SECTION  2012  . COLPOSCOPY W/ BIOPSY / CURETTAGE  ~2009    Family History  Problem Relation Age of Onset  . Arthritis Mother     rheumatoid arthritis  . Alcohol abuse Mother   . Hypothyroidism Mother   . Anxiety disorder Mother   . Depression Mother   . Hypertension Father   . Diabetes Maternal Grandmother   . Fibromyalgia Maternal Grandmother   . Heart disease Neg Hx   . Bipolar disorder Sister   . ADD / ADHD Sister   . Drug abuse Sister   . Kidney disease Sister     Social History   Social History    . Marital status: Married    Spouse name: N/A  . Number of children: 1  . Years of education: N/A   Occupational History  . PT aide [employer]   Social History Main Topics  . Smoking status: Never Smoker  . Smokeless tobacco: Never Used  . Alcohol use No  . Drug use: Yes    Types: Marijuana     Comment: last used about 2 weeks ago  . Sexual activity: Yes    Birth control/ protection: Pill   Other Topics Concern  . Not on file   Social History Narrative  . 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 Thinks that may have led to some of her issues Has had to limit her work schedule due to pain    Objective:   Physical Exam  Constitutional: She appears well-nourished. No distress.  Musculoskeletal:  Some lumbar spine tenderness Right>left SI tenderness as well          Assessment & Plan:

## 2017-01-12 NOTE — Assessment & Plan Note (Signed)
She thinks this is SI dysfunction---will check sed rate and x-ray Dr Patsy Lager to evaluate---may need change in orthotics Will restart tramadol #2 per day if urine screen okay Checked CSRS--nothing besides here

## 2017-01-13 ENCOUNTER — Ambulatory Visit: Payer: BLUE CROSS/BLUE SHIELD | Admitting: Internal Medicine

## 2017-01-15 ENCOUNTER — Other Ambulatory Visit: Payer: Self-pay | Admitting: Internal Medicine

## 2017-01-15 LAB — TOXASSURE SELECT 13 (MW), URINE

## 2017-01-15 NOTE — Telephone Encounter (Signed)
Last filled 12-11-16 #60 Last OV 01-12-17  Pt has an appointment with [facility] 02-25-17. I have not spoken to her about the lab results

## 2017-01-15 NOTE — Telephone Encounter (Signed)
Decline I asked her about the other appointment and she didn't say much. She is changing and after the urine test, will have to decline this controlled substance also

## 2017-01-27 ENCOUNTER — Other Ambulatory Visit: Payer: Self-pay | Admitting: Internal Medicine

## 2017-02-01 ENCOUNTER — Encounter: Payer: Self-pay | Admitting: Internal Medicine

## 2017-02-17 ENCOUNTER — Other Ambulatory Visit: Payer: Self-pay | Admitting: Internal Medicine

## 2017-02-17 NOTE — Telephone Encounter (Signed)
Medication is not on pt current med list. Last OV 01/2017.  pls advise

## 2017-02-18 ENCOUNTER — Other Ambulatory Visit: Payer: Self-pay | Admitting: *Deleted

## 2017-02-18 MED ORDER — FLUCONAZOLE 150 MG PO TABS: 150.0000 mg | ORAL_TABLET | Freq: Once | ORAL | 0 refills | Status: AC

## 2017-02-18 NOTE — Telephone Encounter (Signed)
Approved: #2 x 0

## 2017-02-18 NOTE — Telephone Encounter (Signed)
Medication phoned to pharmacy.  

## 2017-02-25 ENCOUNTER — Ambulatory Visit: Payer: Self-pay | Admitting: Family Medicine

## 2017-03-03 ENCOUNTER — Ambulatory Visit: Payer: BLUE CROSS/BLUE SHIELD | Admitting: Family Medicine

## 2017-08-05 ENCOUNTER — Ambulatory Visit: Payer: BLUE CROSS/BLUE SHIELD | Admitting: Family Medicine

## 2017-08-11 ENCOUNTER — Encounter: Payer: Self-pay | Admitting: Family Medicine

## 2017-08-11 ENCOUNTER — Ambulatory Visit (INDEPENDENT_AMBULATORY_CARE_PROVIDER_SITE_OTHER): Payer: BLUE CROSS/BLUE SHIELD | Admitting: Family Medicine

## 2017-08-11 VITALS — BP 112/80 | HR 101 | Temp 98.5°F | Ht 66.0 in | Wt 158.5 lb

## 2017-08-11 DIAGNOSIS — Q760 Spina bifida occulta: Secondary | ICD-10-CM

## 2017-08-11 DIAGNOSIS — M533 Sacrococcygeal disorders, not elsewhere classified: Secondary | ICD-10-CM | POA: Diagnosis not present

## 2017-08-11 DIAGNOSIS — M5440 Lumbago with sciatica, unspecified side: Secondary | ICD-10-CM | POA: Diagnosis not present

## 2017-08-11 DIAGNOSIS — G8929 Other chronic pain: Secondary | ICD-10-CM

## 2017-08-11 DIAGNOSIS — G5700 Lesion of sciatic nerve, unspecified lower limb: Secondary | ICD-10-CM

## 2017-08-11 MED ORDER — TIZANIDINE HCL 4 MG PO TABS: 4.0000 mg | ORAL_TABLET | Freq: Every evening | ORAL | 3 refills | Status: AC

## 2017-08-11 NOTE — Patient Instructions (Signed)
Excellent Over the Counter Orthotics:  Hapad: available at www.hapad.com (Green Sports Insoles)  SPENCO: Available at some sports stores or www.amazon.com. (I prefer the full-length green orthotic that has a yellow bottom / base)  www.pedifix.com and "[employer] Sports [employer]" website: multiple good foot products  SHOES: Danskos, Merrells, Keens, Clarks, Birkenstocks - good arch support, want minimal bendability. Finn Comfort also excellent, but even more expensive.  Tennis Shoes / Running Shoes: Many good companies and styles. The most important thing is to get a good fit and wear a shoe that feels good in the store. Walk around in the store. Run if that is something that you can do. Some of the running stores have a treadmill, also.  Brooks, New Balance, Saucony are good, but the most important thing is to wear a shoe that fits your foot and feels good.  [employer] in [place]: excellent staff, shoe selection (Running and Athletic Shoes), OTC orthotics. On [address] across from the [employer]. For running shoe and athletic shoe fit, they are the best.  Nicer shoes:  Birkenstock shoes, [place], [place]: Excellent selection  [employer], [address] [place], [place]: They have the largest selection of comfort and supportive shoes that I have ever seen, and their staff is generally quite good in fitting you for shoes. (They will intermittently have sales, mail coupons, and you can look on their website.)  [employer]: [address], [place], [place]    

## 2017-08-11 NOTE — Progress Notes (Signed)
Dr. Denasia Venn T. Matei Magnone, MD, CAQ Sports Medicine Primary Care and Sports Medicine [address] [place], [id number] Phone: [phone] Fax: [phone]  08/11/2017  Patient: Becky Mata, MRN: [id number], DOB: [birth year], 36 y.o.  Primary Physician:  Becky SchwalbeLetvak, Richard I, MD   Chief Complaint  Patient presents with  . Hip Pain  . Back Pain  . Muscel Spasms   Subjective:   Becky Mata is a 36 y.o. very pleasant female patient who presents with the following:  6 months ago - was having bad back and SI joint dysfunction at the time - always in the L side, front hip on the side also.  Was settle on that one side, wondered SI, piriformis. Was doing some extreme IROM and EROM.   SI joint was on fire.  Did some pilates 3 times a week. A bit like legs were going out.   Does a lot of isometrics and self-traction. Some aching and tightness in her nerve. ? GT not aligned right.   Walking was off.   She has been doing better over the last month or so.   Past Medical History, Surgical History, Social History, Family History, Problem List, Medications, and Allergies have been reviewed and updated if relevant.  Patient Active Problem List   Diagnosis Date Noted  . Chronic pain 01/12/2017  . Preventative health care 12/04/2016  . Fibromyalgia 12/04/2016  . 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 Bipolar affective disorder (HCC) 01/18/2013  . Hip pain 06/16/2011    Past Medical History:  Diagnosis Date  . Anxiety   . Bipolar disorder (HCC)   . Personality disorder [facility](HCC)     Past Surgical History:  Procedure Laterality Date  . CESAREAN SECTION  2012  . COLPOSCOPY W/ BIOPSY / CURETTAGE  ~2009    Social History   Social History  . Marital status: Married    Spouse name: N/A  . Number of children: 1  . Years of education: N/A   Occupational History  . PT aide [employer]   Social History Main Topics  . Smoking status: Never Smoker  . Smokeless  tobacco: Never Used  . Alcohol use No  . Drug use: Yes    Types: Marijuana     Comment: last used about 2 weeks ago  . Sexual activity: Yes    Birth control/ protection: Pill   Other Topics Concern  . Not on file   Social History Narrative  . No narrative on file    Family History  Problem Relation Age of Onset  . Arthritis Mother        rheumatoid arthritis  . Alcohol abuse Mother   . Hypothyroidism Mother   . Anxiety disorder Mother   . Depression Mother   . Hypertension Father   . Diabetes Maternal Grandmother   . Fibromyalgia Maternal Grandmother   . Heart disease Neg Hx   . Bipolar disorder Sister   . ADD / ADHD Sister   . Drug abuse Sister   . Kidney disease Sister     No Known Allergies  Medication list reviewed and updated in full in [facility] [facility].  GEN: no acute illness or fever CV: No chest pain or shortness of breath MSK: detailed above Neuro: neurological signs are described above ROS O/w per HPI  Objective:   BP 112/80   Pulse (!) 101   Temp 98.5 F (36.9 C) (Oral)   Ht 5\' 6"  (1.676 m)   Wt 158  lb 8 oz (71.9 kg)   BMI 25.58 kg/m    GEN: Well-developed,well-nourished,in no acute distress; alert,appropriate and cooperative throughout examination HEENT: Normocephalic and atraumatic without obvious abnormalities. Ears, externally no deformities PULM: Breathing comfortably in no respiratory distress EXT: No clubbing, cyanosis, or edema PSYCH: Normally interactive. Cooperative during the interview. Pleasant. Friendly and conversant. Not anxious or depressed appearing. Normal, full affect.  Range of motion at  the waist: Flexion: normal Extension: normal Lateral bending: normal Rotation: all normal  No echymosis or edema Rises to examination table with no difficulty Gait: non antalgic  Inspection/Deformity: N Paraspinus Tenderness: mild, L2-s1  B Ankle Dorsiflexion (L5,4): 5/5 B Great Toe Dorsiflexion (L5,4): 5/5 Heel Walk (L5):  WNL Toe Walk (S1): WNL Rise/Squat (L4): WNL  SENSORY B Medial Foot (L4): WNL B Dorsum (L5): WNL B Lateral (S1): WNL Light Touch: WNL Pinprick: WNL  REFLEXES Knee (L4): 2+ Ankle (S1): 2+  B SLR, seated: neg B SLR, supine: neg B FABER: neg B Reverse FABER: neg B Greater Troch: NT B Log Roll: neg B Stork: NT B Sciatic Notch: mild ttp B  Radiology: Dg Pelvis 1-2 Views  Result Date: 01/12/2017 CLINICAL DATA:  Back and SI joint region pain. EXAM: PELVIS - 1-2 VIEW COMPARISON:  Pelvis and bilateral hips of June 23, 2011 FINDINGS: The bony pelvis is subjectively adequately mineralized. There is no lytic or blastic lesion. There is a spina bifida occulta at S1 and S2. The sacrum and SI joints otherwise are normal in appearance. The observed portions of the lower lumbar spine are normal. IMPRESSION: There is no acute bony abnormality of the pelvis. There is spina bifida occulta at S1 and S2 which appears stable. Electronically Signed   By: David  [place] M.D.   On: 01/12/2017 11:24   Assessment and Plan:   Sacroiliac joint dysfunction of both sides  Piriformis syndrome, unspecified laterality  Chronic low back pain with sciatica, sciatica laterality unspecified, unspecified back pain laterality  Spina bifida occulta  She was fairly worried about spina bifida occulta - Mata showed her the films and what this means. Incidental finding - should not relate to her symptoms.   Improving over time rehabbing on her own. Mata think reasonable to give zanaflex prn when having a lot of back spasm.  Her orthotics are wearing out. Mata will make her a new pair when she is able to return.  Follow-up: Return for at 2:00 PM, 45 minute orthotic appointment.  Meds ordered this encounter  Medications  . tiZANidine (ZANAFLEX) 4 MG tablet    Sig: Take 1 tablet (4 mg total) by mouth Nightly.    Dispense:  30 tablet    Refill:  3   Signed,  Becky Dise T. Attallah Ontko, MD   Allergies as of 08/11/2017    No Known Allergies     Medication List        Accurate as of 08/11/17 11:59 PM. Always use your most recent med list.          clonazePAM 0.5 MG tablet Commonly known as:  KLONOPIN TAKE 1 TABLET BY MOUTH TWICE DAILY AS NEEDED   DULoxetine 30 MG capsule Commonly known as:  CYMBALTA TAKE 2 CAPSULES BY MOUTH EVERY MORNING AND 1 CAPSULE IN THE AFTERNOON   fluconazole 150 MG tablet Commonly known as:  DIFLUCAN TAKE 1 TABLET(150 MG) BY MOUTH 1 TIME   JUNEL FE 1/20 1-20 MG-MCG tablet Generic drug:  norethindrone-ethinyl estradiol TAKE 1 TABLET BY MOUTH DAILY  tiZANidine 4 MG tablet Commonly known as:  ZANAFLEX Take 1 tablet (4 mg total) by mouth Nightly.   triamcinolone cream 0.1 % Commonly known as:  KENALOG Apply 1 application topically 2 (two) times daily as needed.

## 2017-09-27 ENCOUNTER — Other Ambulatory Visit: Payer: Self-pay | Admitting: Internal Medicine

## 2018-01-16 ENCOUNTER — Other Ambulatory Visit: Payer: Self-pay | Admitting: Internal Medicine

## 2018-03-19 IMAGING — DX DG PELVIS 1-2V
1 series · 1 of 1 positions shown · non-contrast
Comparison: Pelvis and bilateral hips June 23, 2011

CLINICAL DATA: Back and SI joint region pain.

EXAM:
PELVIS - 1-2 VIEW

[pelvis ap]
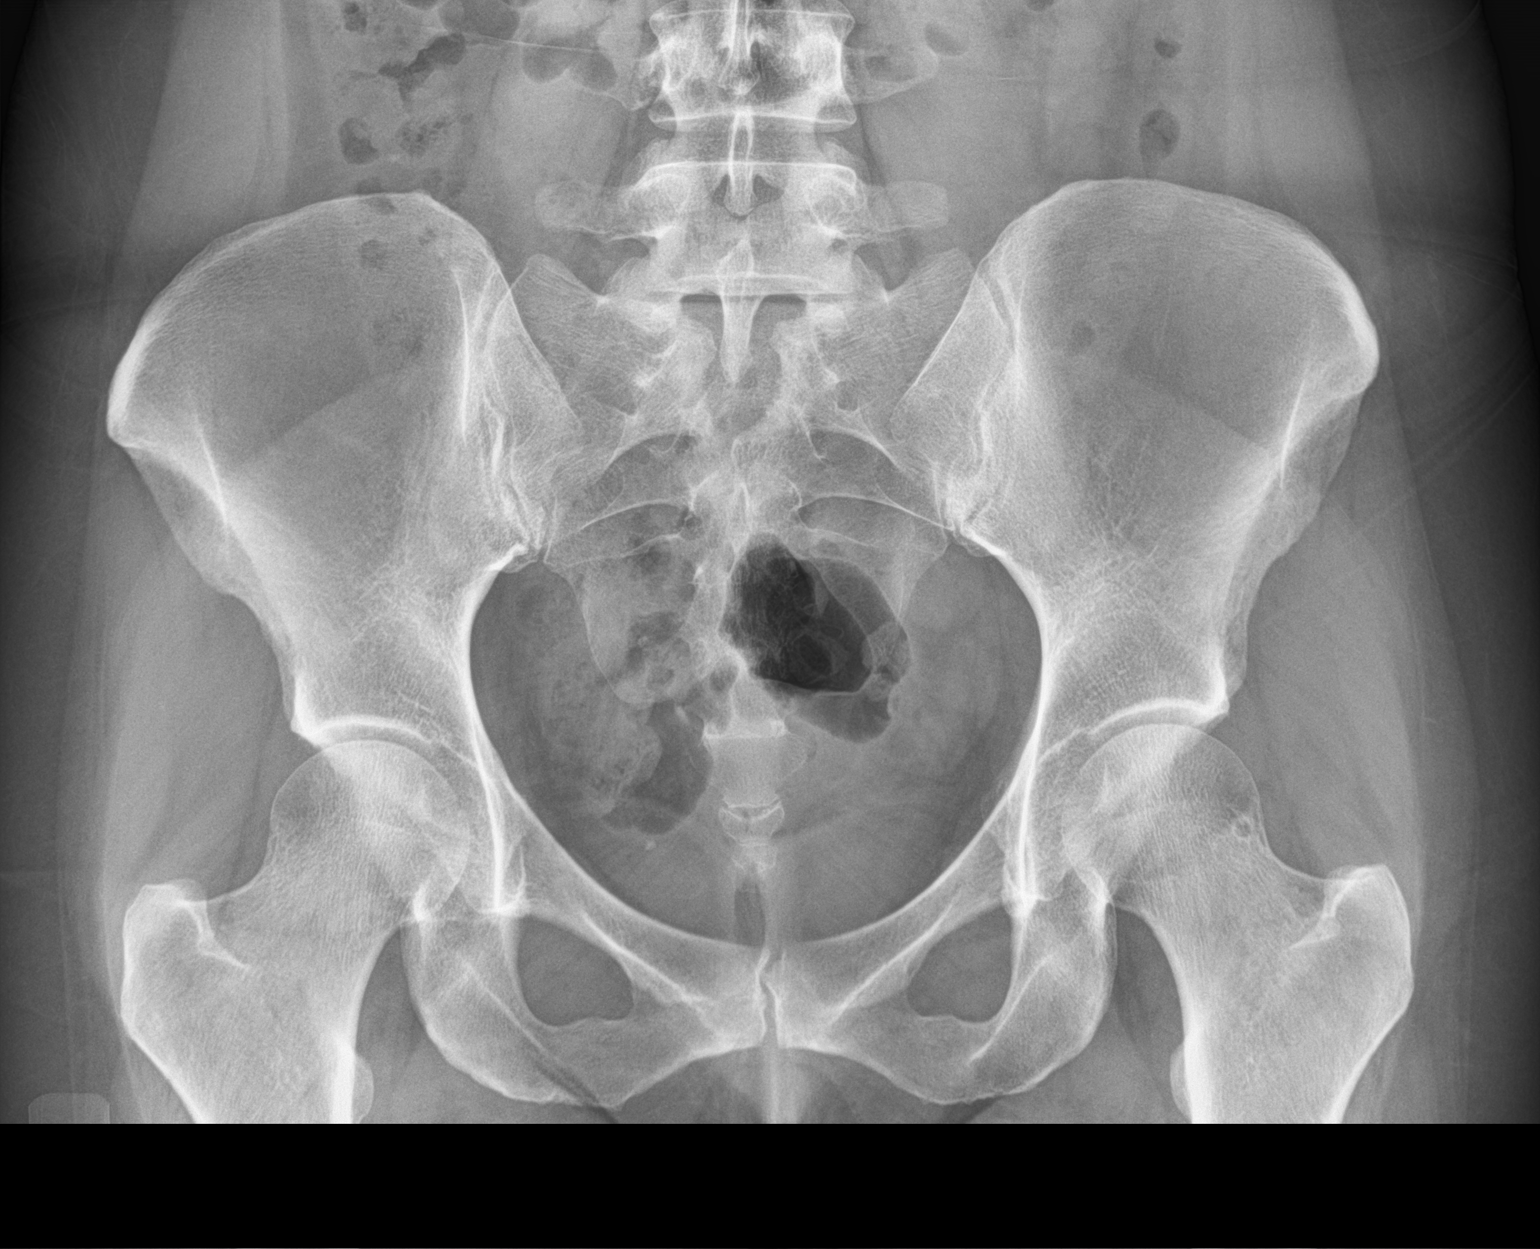

[1 of 1 positions shown; findings below may reference images not displayed]

FINDINGS: The bony pelvis is subjectively adequately mineralized. There is no
lytic or blastic lesion. There is a spina bifida occulta at S1 and
S2. The sacrum and SI joints otherwise are normal in appearance. The
observed portions of the lower lumbar spine are normal.
IMPRESSION: There is no acute bony abnormality of the pelvis. There is spina
bifida occulta at S1 and S2 which appears stable.

## 2018-05-16 ENCOUNTER — Encounter: Payer: Self-pay | Admitting: Internal Medicine

## 2018-06-20 ENCOUNTER — Ambulatory Visit: Payer: PRIVATE HEALTH INSURANCE | Admitting: Internal Medicine

## 2018-06-20 ENCOUNTER — Encounter: Payer: Self-pay | Admitting: Internal Medicine

## 2018-06-20 VITALS — BP 106/70 | HR 75 | Temp 97.6°F | Ht 66.0 in | Wt 130.0 lb

## 2018-06-20 DIAGNOSIS — F3171 Bipolar disorder, in partial remission, most recent episode hypomanic: Secondary | ICD-10-CM

## 2018-06-20 DIAGNOSIS — Z3009 Encounter for other general counseling and advice on contraception: Secondary | ICD-10-CM | POA: Insufficient documentation

## 2018-06-20 DIAGNOSIS — Z23 Encounter for immunization: Secondary | ICD-10-CM | POA: Diagnosis not present

## 2018-06-20 MED ORDER — NORETHIN ACE-ETH ESTRAD-FE 1-20 MG-MCG PO TABS: 1.0000 | ORAL_TABLET | Freq: Every day | ORAL | 3 refills | Status: DC

## 2018-06-20 NOTE — Assessment & Plan Note (Signed)
No problems with OCP in past Will renew Needs full PE soon---will plan to do the pap at that time

## 2018-06-20 NOTE — Assessment & Plan Note (Signed)
Has been doing okay without meds Pain is better now---that is certainly helpful

## 2018-06-20 NOTE — Progress Notes (Signed)
Subjective:    Patient ID: Becky Mata, female    DOB: Aug 27, 1981, 37 y.o.   MRN: [id number]  HPI Here to resume birth control Had to change insurance---now working at [employer] in [place] now--daughter seeing someone closer than  Had been on low dose OCP  Ran out in March Using condoms now/foam  Periods are regular still Past issues with ovarian cysts-- if missed doses of OCP  Is off the tramadol now She thinks a lot of he pain was due to a poor walking style Started stretching and specific exercise Saw Dr Patsy Lager Changes shoes regularly--this has helped  No current outpatient medications on file prior to visit.   No current facility-administered medications on file prior to visit.     No Known Allergies  Past Medical History:  Diagnosis Date  . Anxiety   . Bipolar disorder (HCC)   . Personality disorder [facility])     Past Surgical History:  Procedure Laterality Date  . CESAREAN SECTION  2012  . COLPOSCOPY W/ BIOPSY / CURETTAGE  ~2009    Family History  Problem Relation Age of Onset  . Arthritis Mother        rheumatoid arthritis  . Alcohol abuse Mother   . Hypothyroidism Mother   . Anxiety disorder Mother   . Depression Mother   . Hypertension Father   . Bipolar disorder Sister   . ADD / ADHD Sister   . Drug abuse Sister   . Kidney disease Sister   . Diabetes Maternal Grandmother   . Fibromyalgia Maternal Grandmother   . Heart disease Neg Hx     Social History   Socioeconomic History  . Marital status: Married    Spouse name: Not on file  . Number of children: 1  . Years of education: Not on file  . Highest education level: Not on file  Occupational History  . Occupation: PT aide    Employer: [employer]  Social Needs  . Financial resource strain: Not on file  . Food insecurity:    Worry: Not on file    Inability: Not on file  . Transportation needs:    Medical: Not on file    Non-medical: Not on file  Tobacco Use  .  Smoking status: Never Smoker  . Smokeless tobacco: Never Used  Substance and Sexual Activity  . Alcohol use: No  . Drug use: Yes    Types: Marijuana    Comment: last used about 2 weeks ago  . Sexual activity: Yes    Birth control/protection: Pill  Lifestyle  . Physical activity:    Days per week: Not on file    Minutes per session: Not on file  . Stress: Not on file  Relationships  . Social connections:    Talks on phone: Not on file    Gets together: Not on file    Attends religious service: Not on file    Active member of club or organization: Not on file    Attends meetings of clubs or organizations: Not on file    Relationship status: Not on file  . Intimate partner violence:    Fear of current or ex partner: Not on file    Emotionally abused: Not on file    Physically abused: Not on file    Forced sexual activity: Not on file  Other Topics Concern  . Not on file  Social History Narrative  . Not on file   Review of Systems  Appetite is not great Weight is down slightly Sleeps okay Has started a multivitamin---worried due to easy bruising. This is better now    Objective:   Physical Exam  Constitutional: She appears well-developed. No distress.  Psychiatric:  Slightly pressured speech--her normal Mood is upbeat and appropriate affect           Assessment & Plan:

## 2018-08-22 ENCOUNTER — Ambulatory Visit (INDEPENDENT_AMBULATORY_CARE_PROVIDER_SITE_OTHER): Payer: PRIVATE HEALTH INSURANCE | Admitting: Internal Medicine

## 2018-08-22 ENCOUNTER — Encounter: Payer: Self-pay | Admitting: Internal Medicine

## 2018-08-22 ENCOUNTER — Other Ambulatory Visit (HOSPITAL_COMMUNITY)
Admission: RE | Admit: 2018-08-22 | Discharge: 2018-08-22 | Disposition: A | Discharge status: Home / Self Care | Payer: PRIVATE HEALTH INSURANCE | Source: Ambulatory Visit | Attending: Internal Medicine | Admitting: Internal Medicine

## 2018-08-22 VITALS — BP 94/60 | HR 88 | Temp 98.1°F | Ht 66.5 in | Wt 127.5 lb

## 2018-08-22 DIAGNOSIS — Z0000 Encounter for general adult medical examination without abnormal findings: Secondary | ICD-10-CM | POA: Diagnosis present

## 2018-08-22 DIAGNOSIS — F3172 Bipolar disorder, in full remission, most recent episode hypomanic: Secondary | ICD-10-CM

## 2018-08-22 LAB — COMPREHENSIVE METABOLIC PANEL
ALT: 11 U/L (ref 0–35)
AST: 15 U/L (ref 0–37)
Albumin: 4 g/dL (ref 3.5–5.2)
Alkaline Phosphatase: 37 U/L — ABNORMAL LOW (ref 39–117)
BILIRUBIN TOTAL: 0.2 mg/dL (ref 0.2–1.2)
BUN: 15 mg/dL (ref 6–23)
CO2: 28 meq/L (ref 19–32)
CREATININE: 0.77 mg/dL (ref 0.40–1.20)
Calcium: 8.9 mg/dL (ref 8.4–10.5)
Chloride: 109 mEq/L (ref 96–112)
GFR: 88.49 mL/min (ref >60.00)
Glucose, Bld: 79 mg/dL (ref 70–99)
Potassium: 4.8 mEq/L (ref 3.5–5.1)
Sodium: 141 mEq/L (ref 135–145)
TOTAL PROTEIN: 6.5 g/dL (ref 6.0–8.3)

## 2018-08-22 LAB — LIPID PANEL
Cholesterol: 137 mg/dL (ref 0–200)
HDL: 55 mg/dL (ref >39.00)
LDL CALC: 66 mg/dL (ref 0–99)
NONHDL: 82.09
Total CHOL/HDL Ratio: 2
Triglycerides: 80 mg/dL (ref 0.0–149.0)
VLDL: 16 mg/dL (ref 0.0–40.0)

## 2018-08-22 LAB — CBC
HCT: 37.5 % (ref 36.0–46.0)
Hemoglobin: 12.7 g/dL (ref 12.0–15.0)
MCHC: 33.9 g/dL (ref 30.0–36.0)
MCV: 88.6 fl (ref 78.0–100.0)
Platelets: 264 10*3/uL (ref 150.0–400.0)
RBC: 4.23 Mil/uL (ref 3.87–5.11)
RDW: 13.9 % (ref 11.5–15.5)
WBC: 7.2 10*3/uL (ref 4.0–10.5)

## 2018-08-22 LAB — T4, FREE: Free T4: 0.83 ng/dL (ref 0.60–1.60)

## 2018-08-22 MED ORDER — TRIAMCINOLONE ACETONIDE 0.1 % EX CREA: 1.0000 "application " | TOPICAL_CREAM | Freq: Two times a day (BID) | CUTANEOUS | 1 refills | Status: DC | PRN

## 2018-08-22 NOTE — Assessment & Plan Note (Signed)
Healthy Discussed exercise Flu vaccine yearly Consider mammogram next year PAP done today

## 2018-08-22 NOTE — Addendum Note (Signed)
Addended by: Eual Fines on: 08/22/2018 11:56 AM   Modules accepted: Orders

## 2018-08-22 NOTE — Progress Notes (Addendum)
Subjective:    Patient ID: Becky Mata, female    DOB: 08-30-1981, 37 y.o.   MRN: [id number]  HPI Here for physical  Feels her body is still adjusting to the OCP Just started 2nd pack Has had some mid cycle bleeding with first pack Too soon to tell with second No dyspareunia  Has a lump behind her right ear--?cyst Relates to molars that need to be removed??? No ear pain Started 8 months ago Has noted it behind left ear as well---will occasionally "leak"  Current Outpatient Medications on File Prior to Visit  Medication Sig Dispense Refill  . norethindrone-ethinyl estradiol (JUNEL FE 1/20) 1-20 MG-MCG tablet Take 1 tablet by mouth daily. 28 tablet 3   No current facility-administered medications on file prior to visit.     No Known Allergies  Past Medical History:  Diagnosis Date  . Anxiety   . Bipolar disorder (HCC)   . Personality disorder [facility])     Past Surgical History:  Procedure Laterality Date  . CESAREAN SECTION  2012  . COLPOSCOPY W/ BIOPSY / CURETTAGE  ~2009    Family History  Problem Relation Age of Onset  . Arthritis Mother        rheumatoid arthritis  . Alcohol abuse Mother   . Hypothyroidism Mother   . Anxiety disorder Mother   . Depression Mother   . Hypertension Father   . Bipolar disorder Sister   . ADD / ADHD Sister   . Drug abuse Sister   . Kidney disease Sister   . Diabetes Maternal Grandmother   . Fibromyalgia Maternal Grandmother   . Heart disease Neg Hx     Social History   Socioeconomic History  . Marital status: Married    Spouse name: Not on file  . Number of children: 1  . Years of education: Not on file  . Highest education level: Not on file  Occupational History  . Occupation: PT aide    Employer: [employer]  Social Needs  . Financial resource strain: Not on file  . Food insecurity:    Worry: Not on file    Inability: Not on file  . Transportation needs:    Medical: Not on file    Non-medical: Not  on file  Tobacco Use  . Smoking status: Never Smoker  . Smokeless tobacco: Never Used  Substance and Sexual Activity  . Alcohol use: No  . Drug use: Yes    Types: Marijuana    Comment: last used about 2 weeks ago  . Sexual activity: Yes    Birth control/protection: Pill  Lifestyle  . Physical activity:    Days per week: Not on file    Minutes per session: Not on file  . Stress: Not on file  Relationships  . Social connections:    Talks on phone: Not on file    Gets together: Not on file    Attends religious service: Not on file    Active member of club or organization: Not on file    Attends meetings of clubs or organizations: Not on file    Relationship status: Not on file  . Intimate partner violence:    Fear of current or ex partner: Not on file    Emotionally abused: Not on file    Physically abused: Not on file    Forced sexual activity: Not on file  Other Topics Concern  . Not on file  Social History Narrative  . Not on  file      Review of Systems  Constitutional: Negative for fatigue.       Weight down again Wears seat belt  HENT: Positive for dental problem. Negative for hearing loss and tinnitus.        Has tooth needs to be pulled (couldn't afford cap)  Eyes: Negative for visual disturbance.       No diplopia or unilateral vision loss  Respiratory: Negative for cough, chest tightness and shortness of breath.   Cardiovascular: Negative for chest pain, palpitations and leg swelling.  Gastrointestinal: Negative for blood in stool and constipation.       No heartburn  Endocrine: Negative for polydipsia and polyuria.  Genitourinary: Negative for dysuria and hematuria.  Musculoskeletal: Negative for arthralgias, back pain and joint swelling.  Skin: Negative for rash.       No scalp problems Hair is thinning--- lots of hair on her comb Better with vitamin  Allergic/Immunologic: Negative for environmental allergies and immunocompromised state.  Neurological:  Negative for dizziness, syncope, light-headedness and headaches.  Hematological: Negative for adenopathy. Bruises/bleeds easily.  Psychiatric/Behavioral: Negative for dysphoric mood.       Variable sleep  Not manic recently---gets fired up when she and husband argue       Objective:   Physical Exam  Constitutional: She is oriented to person, place, and time. She appears well-developed. No distress.  HENT:  Head: Normocephalic and atraumatic.  Right Ear: External ear normal.  Left Ear: External ear normal.  Mouth/Throat: Oropharynx is clear and moist. No oropharyngeal exudate.  Eyes: Pupils are equal, round, and reactive to light. Conjunctivae are normal.  Neck: No thyromegaly present.  Cardiovascular: Normal rate, regular rhythm, normal heart sounds and intact distal pulses. Exam reveals no gallop.  No murmur heard. Respiratory: Effort normal and breath sounds normal. No respiratory distress. She has no wheezes. She has no rales.  GI: Soft. There is no tenderness.  Genitourinary:  Genitourinary Comments: Normal appearing cervix and introitus Dark blood obscuring cervix cleaned without problem Pap done (will increase estrogen if ongoing mid cycle bleeding)  Musculoskeletal: She exhibits no edema or tenderness.  Lymphadenopathy:    She has no cervical adenopathy.  Neurological: She is alert and oriented to person, place, and time.  Skin: No rash noted. No erythema.  Mild inflammation behind right ear---no sinus tract or mass (like seborrhea)  Psychiatric: She has a normal mood and affect. Her behavior is normal.           Assessment & Plan:

## 2018-08-22 NOTE — Assessment & Plan Note (Signed)
Has been doing well without Rx

## 2018-08-24 LAB — CYTOLOGY - PAP
Adequacy: ABSENT
Diagnosis: NEGATIVE
HPV: NOT DETECTED

## 2018-09-06 ENCOUNTER — Other Ambulatory Visit: Payer: Self-pay | Admitting: Internal Medicine

## 2019-10-03 ENCOUNTER — Telehealth: Payer: Self-pay | Admitting: Internal Medicine

## 2019-10-03 NOTE — Telephone Encounter (Signed)
Patient called today in regards to a form she received from her new job that she needs filled out  She stated that they are also requesting her to have a tb test done before she starts/ Patient stated that on the form it is asking for vision.hearing. and physical capabilities. Would the provider need to be the one to fill this out for her?   Patient is requesting a call back so she knows if an appointment is required for the form.

## 2019-10-03 NOTE — Telephone Encounter (Signed)
It has been more than a year since she had an OV/CPE. Can you help her get scheduled soon?

## 2019-10-03 NOTE — Telephone Encounter (Signed)
Patient scheduled appointment on 10/09/19. Patient doesn't have insurance.

## 2019-10-09 ENCOUNTER — Ambulatory Visit (INDEPENDENT_AMBULATORY_CARE_PROVIDER_SITE_OTHER): Payer: PRIVATE HEALTH INSURANCE | Admitting: Internal Medicine

## 2019-10-09 ENCOUNTER — Encounter: Payer: Self-pay | Admitting: Internal Medicine

## 2019-10-09 ENCOUNTER — Ambulatory Visit: Payer: PRIVATE HEALTH INSURANCE | Admitting: Internal Medicine

## 2019-10-09 ENCOUNTER — Other Ambulatory Visit: Payer: Self-pay

## 2019-10-09 VITALS — BP 124/84 | HR 76 | Temp 97.9°F | Ht 65.5 in | Wt 129.0 lb

## 2019-10-09 DIAGNOSIS — Z23 Encounter for immunization: Secondary | ICD-10-CM

## 2019-10-09 DIAGNOSIS — Z0000 Encounter for general adult medical examination without abnormal findings: Secondary | ICD-10-CM

## 2019-10-09 NOTE — Addendum Note (Signed)
Addended by: Pilar Grammes on: 10/09/2019 04:13 PM   Modules accepted: Orders

## 2019-10-09 NOTE — Progress Notes (Signed)
Subjective:    Patient ID: Becky Mata, female    DOB: [birth year], 38 y.o.   MRN: [id number]  HPI Here for review of health factors with new job starting  This visit occurred during the SARS-CoV-2 public health emergency.  Safety protocols were in place, including screening questions prior to the visit, additional usage of staff PPE, and extensive cleaning of exam room while observing appropriate contact time as indicated for disinfecting solutions.   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   No current facility-administered medications on file prior to visit.    No Known Allergies  Past Medical History:  Diagnosis Date   Anxiety    Bipolar disorder ([place])    Personality disorder ([place])     Past Surgical History:  Procedure Laterality Date   CESAREAN SECTION  2012   COLPOSCOPY W/ BIOPSY / CURETTAGE  ~2009    Family History  Problem Relation Age of Onset   Arthritis Mother        rheumatoid arthritis   Alcohol abuse Mother    Hypothyroidism Mother    Anxiety disorder Mother    Depression Mother    Hypertension Father    Bipolar disorder Sister    ADD / ADHD Sister    Drug abuse Sister    Kidney disease Sister    Diabetes Maternal Grandmother    Fibromyalgia Maternal Grandmother    Heart disease Neg Hx     Social History   Socioeconomic History   Marital status: Married    Spouse name: Not on file   Number of children: 1   Years of education: Not on file   Highest education level: Not on file  Occupational History   Occupation: PT aide    Comment:  stopped this   Occupation: Charity fundraiser  Tobacco Use   Smoking status: Never Smoker   Smokeless tobacco: Never Used  Substance and Sexual Activity   Alcohol use: No   Drug use: Yes    Types: Marijuana    Comment: last used about 2 weeks ago   Sexual activity: Yes    Birth control/protection: Pill  Other Topics Concern   Not on file  Social History Narrative   Not on file   Social Determinants of Health   Financial Resource Strain:    Difficulty of Paying Living Expenses: Not on file  Food Insecurity:    Worried About Charity fundraiser in the Last Year: Not on file   [employer] of Food in the Last Year: Not on file  Transportation Needs:    Lack of Transportation (Medical): Not on file   Lack of Transportation (Non-Medical): Not on file  Physical Activity:    Days of Exercise per Week: Not on file   Minutes of Exercise per Session: Not on file  Stress:    Feeling of Stress : Not on file  Social Connections:    Frequency of Communication with Friends and Family: Not on file   Frequency of Social Gatherings with Friends and Family: Not on file   Attends Religious Services: Not on file   Active  Member of Clubs or Organizations: Not on file   Attends Banker Meetings: Not on file   Marital Status: Not on file  Intimate Partner Violence:    Fear of Current or Ex-Partner: Not on file   Emotionally Abused: Not on file   Physically Abused: Not on file   Sexually Abused: Not on file   Review of Systems  No chest pain or SOB No joint swelling or pain Mood is better since leaving the SNF---no mania or depression now Sleeps fairly well Appetite is variable--weight is about the same     Objective:   Physical Exam  Constitutional: She appears well-developed. No distress.  Respiratory: Effort normal. No respiratory distress.  Psychiatric: She has a normal mood and affect. Her behavior is normal.           Assessment & Plan:

## 2019-10-09 NOTE — Assessment & Plan Note (Signed)
Will update tetanus Flu vaccine at pharmacy Mood has been okay Form for school district done

## 2020-03-12 ENCOUNTER — Telehealth: Payer: Self-pay | Admitting: Nurse Practitioner

## 2020-03-12 DIAGNOSIS — N898 Other specified noninflammatory disorders of vagina: Secondary | ICD-10-CM

## 2020-03-12 NOTE — Progress Notes (Signed)
Based on what you shared with me, I feel your condition warrants further evaluation and I recommend that you be seen for a face to face visit.  You will need to have a swab done of the discharge so that cause can be determined.   Please contact your primary care physician practice to be seen. Many offices offer virtual options to be seen via video if you are not comfortable going in person to a medical facility at this time.  If you do not have a PCP, [facility] offers a free physician referral service available at [phone]. Our trained staff has the experience, knowledge and resources to put you in touch with a physician who is right for you.   You also have the option of a video visit through [email or web address]  If you are having a true medical emergency please call 911.  NOTE: If you entered your credit card information for this eVisit, you will not be charged. You may see a "hold" on your card for the $35 but that hold will drop off and you will not have a charge processed.  Your e-visit answers were reviewed by a board certified advanced clinical practitioner to complete your personal care plan.  Thank you for using e-Visits.

## 2020-03-13 MED ORDER — DULOXETINE HCL 30 MG PO CPEP: 30.0000 mg | ORAL_CAPSULE | Freq: Two times a day (BID) | ORAL | 1 refills | Status: DC

## 2020-03-13 MED ORDER — NORETHIN ACE-ETH ESTRAD-FE 1-20 MG-MCG(24) PO TABS: 1.0000 | ORAL_TABLET | Freq: Every day | ORAL | 1 refills | Status: DC

## 2020-03-13 NOTE — Telephone Encounter (Signed)
Please have her set up in person or virtual visit within the next month or so

## 2020-04-05 ENCOUNTER — Other Ambulatory Visit: Payer: Self-pay

## 2020-04-05 ENCOUNTER — Encounter: Payer: Self-pay | Admitting: Primary Care

## 2020-04-05 ENCOUNTER — Ambulatory Visit: Payer: BLUE CROSS/BLUE SHIELD | Admitting: Primary Care

## 2020-04-05 VITALS — BP 98/62 | HR 81 | Temp 96.9°F | Wt 132.8 lb

## 2020-04-05 DIAGNOSIS — N898 Other specified noninflammatory disorders of vagina: Secondary | ICD-10-CM

## 2020-04-05 LAB — POC URINALSYSI DIPSTICK (AUTOMATED)
Bilirubin, UA: NEGATIVE
Blood, UA: NEGATIVE
Glucose, UA: NEGATIVE
Leukocytes, UA: NEGATIVE
Nitrite, UA: POSITIVE
Protein, UA: POSITIVE — AB
Specific Gravity: 1.03
Urobilinogen, UA: 0.2 E.U./dL
pH, UA: 6 (ref 5.0–8.0)

## 2020-04-05 NOTE — Assessment & Plan Note (Addendum)
Acute on chronic episode with external vaginal rash. Wet prep sent of and is pending. Offered STD testing but she kindly declined.  She has plenty of her triamcinolone-nystatin cream.  UA today positive for nitrites. Culture pending.  Await results.

## 2020-04-05 NOTE — Patient Instructions (Signed)
We will be in touch with your results once received.  It was a pleasure meeting you!

## 2020-04-05 NOTE — Progress Notes (Signed)
Subjective:    Patient ID: Becky Mata, female    DOB: [birth year], 39 y.o.   MRN: [id number]  HPI  This visit occurred during the SARS-CoV-2 public health emergency.  Safety protocols were in place, including screening questions prior to the visit, additional usage of staff PPE, and extensive cleaning of exam room while observing appropriate contact time as indicated for disinfecting solutions.   Becky Mata is a 39 year old female patient of Dr. Silvio Pate with a history of bipolar disorder, recurrent cystitis, fibromyalgia who presents today with a chief complaint of vaginal discharge.   She has a long history of vaginal symptoms. Historically, prior to the birth of her daughter, she was told that she had a left ovarian cyst, also developed a recurrent external vaginal yeast infection. At the time she was prescribed triamcinolone-nystatin with resolve. Since then she's noticed intermittent lower back pain followed by dark/black vaginal discharge for two days with resolve.   She's been off and on birth control pills for years, stopped last time due to hormonal/mental feelings.  She's noticed vaginal discharge intermittently for the last 2 months, colors change from clear/grey to black/dark red. Also with external vaginal rash, intermittently. At one point she inserted tampons which filled the tampon with clear liquid.   Given these symptoms she had her PCP call in OCP's, and has been taking them for the last 2 weeks. She continues to notice intermittent clear/grey vaginal discharge with dark bleeding.  Review of Systems  Constitutional: Negative for fever.  Gastrointestinal: Negative for abdominal pain.  Genitourinary: Positive for dysuria, vaginal bleeding and vaginal discharge. Negative for frequency, pelvic pain and urgency.  Skin: Positive for rash.       Past Medical History:  Diagnosis Date  . Anxiety   . Bipolar disorder ([place])   . Personality disorder [facility])      Social  History   Socioeconomic History  . Marital status: Married    Spouse name: Not on file  . Number of children: 1  . Years of education: Not on file  . Highest education level: Not on file  Occupational History  . 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 Occupation: Charity fundraiser  Tobacco Use  . Smoking status: Never Smoker  . Smokeless tobacco: Never Used  Substance and Sexual Activity  . Alcohol use: No  . Drug use: Yes    Types: Marijuana    Comment: last used about 2 weeks ago  . Sexual activity: Yes    Birth control/protection: Pill  Other Topics Concern  . Not on file  Social History Narrative  . Not on file   Social Determinants of Health   Financial Resource Strain:   . Difficulty of Paying Living Expenses:   Food Insecurity:   . Worried About Charity fundraiser in the Last Year:   . Arboriculturist in the Last Year:   Transportation Needs:   . Film/video editor (Medical):   Marland Kitchen Lack of Transportation (Non-Medical):   Physical Activity:   . Days of Exercise per Week:   . Minutes of Exercise per Session:   Stress:   . Feeling of Stress :   Social Connections:   . Frequency of Communication with Friends and Family:   . Frequency of Social Gatherings with Friends and Family:   . Attends Religious Services:   . Active Member of Clubs or Organizations:   . Attends Archivist Meetings:   .  Marital Status:   Intimate Partner Violence:   . Fear of Current or Ex-Partner:   . Emotionally Abused:   Marland Kitchen Physically Abused:   . Sexually Abused:     Past Surgical History:  Procedure Laterality Date  . CESAREAN SECTION  2012  . COLPOSCOPY W/ BIOPSY / CURETTAGE  ~2009    Family History  Problem Relation Age of Onset  . Arthritis Mother        rheumatoid arthritis  . Alcohol abuse Mother   . Hypothyroidism Mother   . Anxiety disorder Mother   . Depression Mother   . Hypertension Father   . Bipolar disorder Sister   . ADD / ADHD Sister     . Drug abuse Sister   . Kidney disease Sister   . Diabetes Maternal Grandmother   . Fibromyalgia Maternal Grandmother   . Heart disease Neg Hx     No Known Allergies  Current Outpatient Medications on File Prior to Visit  Medication Sig Dispense Refill  . DULoxetine (CYMBALTA) 30 MG capsule Take 1 capsule (30 mg total) by mouth 2 (two) times daily. 60 capsule 1  . Norethindrone Acetate-Ethinyl Estrad-FE (LOESTRIN 24 FE) 1-20 MG-MCG(24) tablet Take 1 tablet by mouth daily. 3 Package 1   No current facility-administered medications on file prior to visit.    BP 98/62 (BP Location: Right Arm, Patient Position: Sitting, Cuff Size: Normal)   Pulse 81   Temp (!) 96.9 F (36.1 C) (Temporal)   Wt 132 lb 12 oz (60.2 kg)   LMP 04/05/2020   SpO2 99%   BMI 21.75 kg/m    Objective:   Physical Exam  Respiratory: Effort normal.  Genitourinary:    Right adnexa and left adnexa normal.     Pelvic exam was performed with patient in the lithotomy position.  There is rash on the right labia. There is rash on the left labia. Cervix exhibits discharge. Cervix exhibits no motion tenderness.    Genitourinary Comments: Scant amount of greyish discharge   Musculoskeletal:     Cervical back: Neck supple.  Skin: Skin is warm and dry.  Light pink, scaly, vaginal rash extending to buttocks           Assessment & Plan:

## 2020-04-07 LAB — WET PREP BY MOLECULAR PROBE
Candida species: NOT DETECTED
Gardnerella vaginalis: NOT DETECTED
MICRO NUMBER:: 10635632
SPECIMEN QUALITY:: ADEQUATE
Trichomonas vaginosis: NOT DETECTED

## 2020-04-07 LAB — URINE CULTURE
MICRO NUMBER:: 10635633
SPECIMEN QUALITY:: ADEQUATE

## 2020-04-08 ENCOUNTER — Telehealth: Payer: Self-pay

## 2020-04-08 ENCOUNTER — Other Ambulatory Visit: Payer: Self-pay | Admitting: Primary Care

## 2020-04-08 DIAGNOSIS — N3000 Acute cystitis without hematuria: Secondary | ICD-10-CM

## 2020-04-08 MED ORDER — SULFAMETHOXAZOLE-TRIMETHOPRIM 800-160 MG PO TABS: 1.0000 | ORAL_TABLET | Freq: Two times a day (BID) | ORAL | 0 refills | Status: DC

## 2020-04-08 NOTE — Telephone Encounter (Signed)
Patient aware of results and recommendations. °

## 2020-04-08 NOTE — Telephone Encounter (Signed)
-----   Message from Doreene Nest, NP sent at 04/08/2020  4:19 PM EDT ----- Please notify patient:  She tested negative for trichomonas, bacterial vaginosis, yeast infection.  She did test positive for UTI so I would like for her to start antibiotics. Start Bactrim DS (sulfamethoxazole/trimethoprim) tablets for urinary tract infection. Take 1 tablet by mouth twice daily for 5 days.

## 2020-04-13 ENCOUNTER — Other Ambulatory Visit: Payer: Self-pay | Admitting: Internal Medicine

## 2020-05-23 ENCOUNTER — Other Ambulatory Visit: Payer: Self-pay

## 2020-05-23 ENCOUNTER — Telehealth: Payer: Self-pay | Admitting: Internal Medicine

## 2020-05-23 DIAGNOSIS — Z0289 Encounter for other administrative examinations: Secondary | ICD-10-CM

## 2020-08-14 ENCOUNTER — Other Ambulatory Visit: Payer: Self-pay

## 2020-08-14 ENCOUNTER — Ambulatory Visit: Payer: BC Managed Care – PPO | Admitting: Internal Medicine

## 2020-08-14 ENCOUNTER — Encounter: Payer: Self-pay | Admitting: Internal Medicine

## 2020-08-14 VITALS — BP 114/70 | HR 96 | Temp 98.0°F | Wt 134.8 lb

## 2020-08-14 DIAGNOSIS — F310 Bipolar disorder, current episode hypomanic: Secondary | ICD-10-CM | POA: Diagnosis not present

## 2020-08-14 DIAGNOSIS — N898 Other specified noninflammatory disorders of vagina: Secondary | ICD-10-CM

## 2020-08-14 LAB — POC URINALSYSI DIPSTICK (AUTOMATED)
Glucose, UA: NEGATIVE
Ketones, UA: NEGATIVE
Leukocytes, UA: NEGATIVE
Nitrite, UA: NEGATIVE
Protein, UA: NEGATIVE
Spec Grav, UA: >=1.03 — AB (ref 1.010–1.025)
Urobilinogen, UA: NEGATIVE E.U./dL — AB
pH, UA: 5.5 (ref 5.0–8.0)

## 2020-08-14 MED ORDER — DULOXETINE HCL 30 MG PO CPEP: 30.0000 mg | ORAL_CAPSULE | Freq: Two times a day (BID) | ORAL | 3 refills | Status: DC

## 2020-08-14 MED ORDER — NORETHIN ACE-ETH ESTRAD-FE 1-20 MG-MCG(24) PO TABS: 1.0000 | ORAL_TABLET | Freq: Every day | ORAL | 3 refills | Status: DC

## 2020-08-14 NOTE — Assessment & Plan Note (Signed)
Having fatigue, stress, etc Irritability No true urinary symptoms---urinalysis is negative Will restart the duloxetine--hopefully that will settle things down

## 2020-08-14 NOTE — Progress Notes (Signed)
Subjective:    Patient ID: Becky Mata, female    DOB: 05/20/1981, 39 y.o.   MRN: [id number]  HPI Here due to urinary symptoms This visit occurred during the SARS-CoV-2 public health emergency.  Safety protocols were in place, including screening questions prior to the visit, additional usage of staff PPE, and extensive cleaning of exam room while observing appropriate contact time as indicated for disinfecting solutions.   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 DULoxetine (CYMBALTA) 30 MG capsule TAKE 1 CAPSULE (30 MG TOTAL) BY MOUTH 2 (TWO) TIMES DAILY. (Patient not taking: Reported on 08/14/2020) 180 capsule 0  . Norethindrone Acetate-Ethinyl Estrad-FE (LOESTRIN 24 FE) 1-20 MG-MCG(24) tablet Take 1 tablet by mouth daily. (Patient not taking: Reported on 08/14/2020) 3 Package 1   No current facility-administered medications on file prior to visit.    No Known Allergies  Past Medical History:  Diagnosis Date  . Anxiety   . Bipolar disorder ([facility])   . Personality disorder [facility])     Past Surgical History:  Procedure Laterality Date  . CESAREAN SECTION  2012  . COLPOSCOPY W/ BIOPSY / CURETTAGE  ~2009    Family History  Problem Relation Age of Onset  . Arthritis Mother        rheumatoid arthritis  . Alcohol abuse Mother   . Hypothyroidism Mother   . Anxiety disorder Mother   . Depression Mother   . Hypertension Father   . Bipolar disorder Sister   . ADD / ADHD Sister   . Drug abuse Sister   . Kidney disease Sister   . Diabetes Maternal Grandmother   . Fibromyalgia  Maternal Grandmother   . Heart disease Neg Hx     Social History   Socioeconomic History  . Marital status: Married    Spouse name: Not on file  . Number of children: 1  . Years of education: Not on file  . Highest education level: Not on file  Occupational History  . 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 Occupation: Conservation officer, nature  Tobacco Use  . Smoking status: Never Smoker  . Smokeless tobacco: Never Used  Substance and Sexual Activity  . Alcohol use: No  . Drug use: Yes    Types: Marijuana    Comment: last used about 2 weeks ago  . Sexual activity: Yes    Birth control/protection: Pill  Other Topics Concern  . Not on file  Social History Narrative  . Not on file   Social Determinants of Health   Financial Resource Strain:   . Difficulty of Paying Living Expenses: Not on file  Food Insecurity:   . Worried About Programme researcher, broadcasting/film/video in the Last Year: Not on file  . Ran Out of Food in the Last Year: Not on file  Transportation Needs:   . Lack of Transportation (Medical): Not on file  . Lack of Transportation (Non-Medical): Not on file  Physical Activity:   . Days of Exercise per Week: Not on file  . Minutes of Exercise per Session: Not on file  Stress:   .  Feeling of Stress : Not on file  Social Connections:   . Frequency of Communication with Friends and Family: Not on file  . Frequency of Social Gatherings with Friends and Family: Not on file  . Attends Religious Services: Not on file  . Active Member of Clubs or Organizations: Not on file  . Attends Banker Meetings: Not on file  . Marital Status: Not on file  Intimate Partner Violence:   . Fear of Current or Ex-Partner: Not on file  . Emotionally Abused: Not on file  . Physically Abused: Not on file  . Sexually Abused: Not on file   Review of Systems  Eating okay Weight is stable Sleeps okay Same stress at home--but still together with husband     Objective:   Physical  Exam Constitutional:      Appearance: Normal appearance.  Abdominal:     Palpations: Abdomen is soft.     Tenderness: There is no abdominal tenderness.  Neurological:     Mental Status: She is alert.  Psychiatric:     Comments: No mania but slightly pressured speech            Assessment & Plan:

## 2020-08-14 NOTE — Addendum Note (Signed)
Addended by: Lindell Spar D on: 08/14/2020 11:08 AM   Modules accepted: Orders

## 2021-10-27 ENCOUNTER — Ambulatory Visit (INDEPENDENT_AMBULATORY_CARE_PROVIDER_SITE_OTHER)
Admission: RE | Admit: 2021-10-27 | Discharge: 2021-10-27 | Disposition: A | Discharge status: Home / Self Care | Payer: BC Managed Care – PPO | Source: Ambulatory Visit | Attending: Nurse Practitioner | Admitting: Nurse Practitioner

## 2021-10-27 ENCOUNTER — Other Ambulatory Visit: Payer: Self-pay

## 2021-10-27 ENCOUNTER — Ambulatory Visit: Payer: BC Managed Care – PPO | Admitting: Nurse Practitioner

## 2021-10-27 VITALS — BP 112/78 | HR 103 | Temp 98.0°F | Resp 14 | Ht 65.5 in | Wt 135.4 lb

## 2021-10-27 DIAGNOSIS — R300 Dysuria: Secondary | ICD-10-CM | POA: Diagnosis not present

## 2021-10-27 DIAGNOSIS — R1030 Lower abdominal pain, unspecified: Secondary | ICD-10-CM

## 2021-10-27 DIAGNOSIS — R3129 Other microscopic hematuria: Secondary | ICD-10-CM | POA: Diagnosis not present

## 2021-10-27 DIAGNOSIS — K921 Melena: Secondary | ICD-10-CM | POA: Insufficient documentation

## 2021-10-27 LAB — HEMOCCULT GUIAC POC 1CARD (OFFICE): Fecal Occult Blood, POC: POSITIVE — AB

## 2021-10-27 LAB — POCT URINALYSIS DIPSTICK
Bilirubin, UA: NEGATIVE
Glucose, UA: NEGATIVE
Ketones, UA: NEGATIVE
Leukocytes, UA: NEGATIVE
Nitrite, UA: NEGATIVE
Protein, UA: POSITIVE — AB
Spec Grav, UA: 1.025 (ref 1.010–1.025)
Urobilinogen, UA: 0.2 E.U./dL
pH, UA: 5.5 (ref 5.0–8.0)

## 2021-10-27 LAB — POCT URINE PREGNANCY: Preg Test, Ur: NEGATIVE

## 2021-10-27 MED ORDER — NITROFURANTOIN MONOHYD MACRO 100 MG PO CAPS: 100.0000 mg | ORAL_CAPSULE | Freq: Two times a day (BID) | ORAL | 0 refills | Status: DC

## 2021-10-27 NOTE — Assessment & Plan Note (Signed)
Patient was describing blood in urine and blood in stool specially on wiping.  Hemoccult did come back positive rectal exam was benign we will check basic labs in office and refer to GI

## 2021-10-27 NOTE — Progress Notes (Signed)
Acute Office Visit  Subjective:    Patient ID: Becky Mata, female    DOB: [birth year], 41 y.o.   MRN: [id number]  Chief Complaint  Patient presents with   Flank Pain    Started on 10/23/21, burning with urination, some left flank pain, frequency, low back pain, legs feel achy, some blood with urination.     Patient is in today for Flank pain   States that Thursday during the day she felt like she had hard stool and some red blood passed Firday after work she noticed pain and blood. Thursday Friday her low back and legs hurt.  Would wax an wan on the amount of blood when she peed and when she had BMS Last BM was today, normal for her but states some blood on the tissue  Left side flank pain. Intermittent. No history of kidney stones.  Past Medical History:  Diagnosis Date   Anxiety    Bipolar disorder ([place])    Personality disorder [facility])     Past Surgical History:  Procedure Laterality Date   CESAREAN SECTION  2012   COLPOSCOPY W/ BIOPSY / CURETTAGE  ~2009    Family History  Problem Relation Age of Onset   Arthritis Mother        rheumatoid arthritis   Alcohol abuse Mother    Hypothyroidism Mother    Anxiety disorder Mother    Depression Mother    Hypertension Father    Bipolar disorder Sister    ADD / ADHD Sister    Drug abuse Sister    Kidney disease Sister    Diabetes Maternal Grandmother    Fibromyalgia Maternal Grandmother    Heart disease Neg Hx     Social History   Socioeconomic History   Marital status: Married    Spouse name: Not on file   Number of children: 1   Years of education: Not on file   Highest education level: Not on file  Occupational History   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   Occupation: Charity fundraiser  Tobacco Use   Smoking status: Never   Smokeless tobacco: Never  Substance and Sexual Activity   Alcohol use: No   Drug use: Yes    Types: Marijuana    Comment: last used about 2 weeks ago   Sexual  activity: Yes    Birth control/protection: Pill  Other Topics Concern   Not on file  Social History Narrative   Not on file   Social Determinants of Health   Financial Resource Strain: Not on file  Food Insecurity: Not on file  Transportation Needs: Not on file  Physical Activity: Not on file  Stress: Not on file  Social Connections: Not on file  Intimate Partner Violence: Not on file    Outpatient Medications Prior to Visit  Medication Sig Dispense Refill   DULoxetine (CYMBALTA) 30 MG capsule Take 1 capsule (30 mg total) by mouth 2 (two) times daily. 180 capsule 3   Norethindrone Acetate-Ethinyl Estrad-FE (LOESTRIN 24 FE) 1-20 MG-MCG(24) tablet Take 1 tablet by mouth daily. 84 tablet 3   No facility-administered medications prior to visit.    No Known Allergies  Review of Systems  Constitutional:  Negative for chills and fever.  Respiratory:  Negative for shortness of breath.   Cardiovascular:  Negative for chest pain.  Gastrointestinal:  Positive for abdominal pain and constipation. Negative for nausea and vomiting.  Genitourinary:  Positive for dysuria (friday and saturday  not currently), flank pain and hematuria. Negative for frequency, urgency, vaginal bleeding, vaginal discharge and vaginal pain.      Objective:    Physical Exam Vitals and nursing note reviewed. Exam conducted with a chaperone present [facility]).  Constitutional:      General: She is not in acute distress.    Appearance: Normal appearance. She is not ill-appearing.  Cardiovascular:     Rate and Rhythm: Normal rate and regular rhythm.  Pulmonary:     Effort: Pulmonary effort is normal.     Breath sounds: Normal breath sounds.  Abdominal:     General: Bowel sounds are normal. There is no distension.     Palpations: There is no mass.     Tenderness: There is no abdominal tenderness. There is no right CVA tenderness or left CVA tenderness.  Genitourinary:    Rectum: Guaiac result  positive. No mass, anal fissure, external hemorrhoid or internal hemorrhoid. Normal anal tone.  Skin:    General: Skin is warm.  Neurological:     Mental Status: She is alert.    BP 112/78    Pulse (!) 103    Temp 98 F (36.7 C)    Resp 14    Ht 5' 5.5" (1.664 m)    Wt 135 lb 6 oz (61.4 kg)    LMP 10/13/2021    SpO2 98%    BMI 22.18 kg/m  Wt Readings from Last 3 Encounters:  10/27/21 135 lb 6 oz (61.4 kg)  08/14/20 134 lb 12.8 oz (61.1 kg)  04/05/20 132 lb 12 oz (60.2 kg)    Health Maintenance Due  Topic Date Due   COVID-19 Vaccine (1) Never done   Hepatitis C Screening  Never done   INFLUENZA VACCINE  05/12/2021   PAP SMEAR-Modifier  08/22/2021    There are no preventive care reminders to display for this patient.   Lab Results  Component Value Date   TSH 1.42 06/23/2011   Lab Results  Component Value Date   WBC 7.2 08/22/2018   HGB 12.7 08/22/2018   HCT 37.5 08/22/2018   MCV 88.6 08/22/2018   PLT 264.0 08/22/2018   Lab Results  Component Value Date   NA 141 08/22/2018   K 4.8 08/22/2018   CO2 28 08/22/2018   GLUCOSE 79 08/22/2018   BUN 15 08/22/2018   CREATININE 0.77 08/22/2018   BILITOT 0.2 08/22/2018   ALKPHOS 37 (L) 08/22/2018   AST 15 08/22/2018   ALT 11 08/22/2018   PROT 6.5 08/22/2018   ALBUMIN 4.0 08/22/2018   CALCIUM 8.9 08/22/2018   ANIONGAP 8 07/08/2015   GFR 88.49 08/22/2018   Lab Results  Component Value Date   CHOL 137 08/22/2018   Lab Results  Component Value Date   HDL 55.00 08/22/2018   Lab Results  Component Value Date   LDLCALC 66 08/22/2018   Lab Results  Component Value Date   TRIG 80.0 08/22/2018   Lab Results  Component Value Date   CHOLHDL 2 08/22/2018   No results found for: HGBA1C     Assessment & Plan:   Problem List Items Addressed This Visit       Genitourinary   Other microscopic hematuria    Given patient's history and exam we will proactively treat for assumed urinary tract infection.  Send urine  off for culture pending abdominal x-ray.  Discussed with patient she is in agreement      Relevant Medications   nitrofurantoin,  macrocrystal-monohydrate, (MACROBID) 100 MG capsule   Other Relevant Orders   Urine Culture     Other   Dysuria    Proactively treat with Macrobid 100 mg twice daily for 5 days.  Pending urine culture      Relevant Medications   nitrofurantoin, macrocrystal-monohydrate, (MACROBID) 100 MG capsule   Other Relevant Orders   POCT urinalysis dipstick (Completed)   Urine Culture   Blood in stool    Patient was describing blood in urine and blood in stool specially on wiping.  Hemoccult did come back positive rectal exam was benign we will check basic labs in office and refer to GI      Relevant Orders   Hemoccult - 1 Card (office) (Completed)   Ambulatory referral to Gastroenterology   CBC   Comprehensive metabolic panel   Lower abdominal pain - Primary    Nonspecific, nonreproducible on exam.  Patient's pregnancy test was negative UA did have hematuria pending abdominal x-ray.      Relevant Orders   DG Abd 2 Views (Completed)   POCT urine pregnancy (Completed)   CBC   Comprehensive metabolic panel     Meds ordered this encounter  Medications   nitrofurantoin, macrocrystal-monohydrate, (MACROBID) 100 MG capsule    Sig: Take 1 capsule (100 mg total) by mouth 2 (two) times daily.    Dispense:  10 capsule    Refill:  0    Order Specific Question:   Supervising Provider    Answer:   Loura Pardon A [[id number]]   This visit occurred during the SARS-CoV-2 public health emergency.  Safety protocols were in place, including screening questions prior to the visit, additional usage of staff PPE, and extensive cleaning of exam room while observing appropriate contact time as indicated for disinfecting solutions.   Romilda Garret, NP

## 2021-10-27 NOTE — Patient Instructions (Addendum)
Nice to see you today Did look like the stool had some blood in it. I will refer you to GI I will be in touch with the lab and xray results

## 2021-10-27 NOTE — Assessment & Plan Note (Signed)
Given patient's history and exam we will proactively treat for assumed urinary tract infection.  Send urine off for culture pending abdominal x-ray.  Discussed with patient she is in agreement

## 2021-10-27 NOTE — Assessment & Plan Note (Signed)
Proactively treat with Macrobid 100 mg twice daily for 5 days.  Pending urine culture

## 2021-10-27 NOTE — Assessment & Plan Note (Signed)
Nonspecific, nonreproducible on exam.  Patient's pregnancy test was negative UA did have hematuria pending abdominal x-ray.

## 2021-10-28 LAB — COMPREHENSIVE METABOLIC PANEL
ALT: 10 U/L (ref 0–35)
AST: 16 U/L (ref 0–37)
Albumin: 4.2 g/dL (ref 3.5–5.2)
Alkaline Phosphatase: 48 U/L (ref 39–117)
BUN: 12 mg/dL (ref 6–23)
CO2: 27 mEq/L (ref 19–32)
Calcium: 9.1 mg/dL (ref 8.4–10.5)
Chloride: 104 mEq/L (ref 96–112)
Creatinine, Ser: 0.7 mg/dL (ref 0.40–1.20)
GFR: 106.5 mL/min (ref >60.00)
Glucose, Bld: 81 mg/dL (ref 70–99)
Potassium: 4 mEq/L (ref 3.5–5.1)
Sodium: 138 mEq/L (ref 135–145)
Total Bilirubin: 0.2 mg/dL (ref 0.2–1.2)
Total Protein: 7.1 g/dL (ref 6.0–8.3)

## 2021-10-28 LAB — URINE CULTURE
MICRO NUMBER:: 12875460
SPECIMEN QUALITY:: ADEQUATE

## 2021-10-28 LAB — CBC
HCT: 37.8 % (ref 36.0–46.0)
Hemoglobin: 12.3 g/dL (ref 12.0–15.0)
MCHC: 32.4 g/dL (ref 30.0–36.0)
MCV: 85.5 fl (ref 78.0–100.0)
Platelets: 250 10*3/uL (ref 150.0–400.0)
RBC: 4.42 Mil/uL (ref 3.87–5.11)
RDW: 14.4 % (ref 11.5–15.5)
WBC: 8.7 10*3/uL (ref 4.0–10.5)

## 2022-01-30 ENCOUNTER — Encounter: Payer: Self-pay | Admitting: Nurse Practitioner

## 2022-04-06 ENCOUNTER — Telehealth: Payer: Self-pay

## 2022-05-04 ENCOUNTER — Encounter: Payer: Self-pay | Admitting: Internal Medicine

## 2022-05-04 NOTE — Telephone Encounter (Signed)
Patient called in stating she need a physical done for work before August 18th. Can we still use a same day spot or office visit? Thank you!

## 2022-05-04 NOTE — Telephone Encounter (Signed)
error 

## 2022-05-27 ENCOUNTER — Encounter: Payer: Self-pay | Admitting: Internal Medicine

## 2022-05-27 ENCOUNTER — Ambulatory Visit (INDEPENDENT_AMBULATORY_CARE_PROVIDER_SITE_OTHER): Payer: BC Managed Care – PPO | Admitting: Internal Medicine

## 2022-05-27 DIAGNOSIS — F3170 Bipolar disorder, currently in remission, most recent episode unspecified: Secondary | ICD-10-CM

## 2022-05-27 DIAGNOSIS — Z0000 Encounter for general adult medical examination without abnormal findings: Secondary | ICD-10-CM

## 2022-05-27 NOTE — Assessment & Plan Note (Addendum)
Healthy Prefers no COVID vaccine Will consider flu vaccine Colon cancer screening at 43 Pap due next year---she plans to go to gyn (consider mammogram)  Form for work done

## 2022-05-27 NOTE — Assessment & Plan Note (Signed)
Not on meds Less stress now--so doing well without meds

## 2022-05-27 NOTE — Progress Notes (Signed)
Subjective:    Patient ID: Maniyah Moller, female    DOB: [birth year], 41 y.o.   MRN: [id number]  HPI Here for physical Changing to [employer]---needs form done  Reviewed last visit Had some blood in her stool----but that stopped [place] was positive--but it wasn't FIT No symptoms since then Only saw blood once since then---only on toilet paper  Now separated --doing okay with this Has the duloxetine--but not really taking.  Less stressors--like marital issues  No current outpatient medications on file prior to visit.   No current facility-administered medications on file prior to visit.    No Known Allergies  Past Medical History:  Diagnosis Date   Anxiety    Bipolar disorder (HCC)    Personality disorder (HCC)     Past Surgical History:  Procedure Laterality Date   CESAREAN SECTION  2012   COLPOSCOPY W/ BIOPSY / CURETTAGE  ~2009    Family History  Problem Relation Age of Onset   Arthritis Mother        rheumatoid arthritis   Alcohol abuse Mother    Hypothyroidism Mother    Anxiety disorder Mother    Depression Mother    Hypertension Father    Bipolar disorder Sister    ADD / ADHD Sister    Drug abuse Sister    Kidney disease Sister    Diabetes Maternal Grandmother    Fibromyalgia Maternal Grandmother    Heart disease Neg Hx     Social History   Socioeconomic History   Marital status: Legally Separated    Spouse name: Not on file   Number of children: 1   Years of education: Not on file   Highest education level: Not on file  Occupational History   Occupation: Conservation officer, nature    Employer: [employer]  Tobacco Use   Smoking status: Never    Passive exposure: Never   Smokeless tobacco: Never  Substance and Sexual Activity   Alcohol use: No   Drug use: Yes    Types: Marijuana    Comment: last used about 2 weeks ago   Sexual activity: Yes    Birth control/protection: Pill  Other Topics Concern   Not on  file  Social History Narrative   Not on file   Social Determinants of Health   Financial Resource Strain: Not on file  Food Insecurity: Not on file  Transportation Needs: Not on file  Physical Activity: Not on file  Stress: Not on file  Social Connections: Not on file  Intimate Partner Violence: Not on file   Review of Systems  Constitutional:  Negative for fatigue and unexpected weight change.       Wears seat belt  HENT:  Negative for hearing loss and tinnitus.        Cracked tooth--needs to go to dentist  Eyes:  Negative for visual disturbance.       No diplopia or unilateral vision loss  Respiratory:  Negative for cough, chest tightness and shortness of breath.   Cardiovascular:  Negative for chest pain, palpitations and leg swelling.  Gastrointestinal:  Negative for blood in stool and constipation.       No heartburn  Endocrine: Negative for polydipsia and polyuria.  Genitourinary:  Negative for dyspareunia, dysuria and hematuria.       Periods variable---may go 6 weeks. Better off the hormones  Musculoskeletal:  Negative for arthralgias, back pain and joint swelling.  Skin:  Negative for rash.  Allergic/Immunologic: Negative for  environmental allergies and immunocompromised state.  Neurological:  Negative for dizziness, syncope and light-headedness.       Rare tension headache  Hematological:  Negative for adenopathy. Does not bruise/bleed easily.  Psychiatric/Behavioral:  Negative for dysphoric mood. The patient is not nervous/anxious.        Sleep is variable Not irritable Legs jump at night at times (has been very active with lifeguarding ,etc)       Objective:   Physical Exam Constitutional:      Appearance: Normal appearance.  HENT:     Mouth/Throat:     Pharynx: No oropharyngeal exudate or posterior oropharyngeal erythema.  Eyes:     Conjunctiva/sclera: Conjunctivae normal.     Pupils: Pupils are equal, round, and reactive to light.  Cardiovascular:      Rate and Rhythm: Normal rate and regular rhythm.     Pulses: Normal pulses.     Heart sounds: No murmur heard.    No gallop.  Pulmonary:     Effort: Pulmonary effort is normal.     Breath sounds: Normal breath sounds. No wheezing or rales.  Abdominal:     Palpations: Abdomen is soft.     Tenderness: There is no abdominal tenderness.  Musculoskeletal:     Cervical back: Neck supple.     Right lower leg: No edema.     Left lower leg: No edema.  Lymphadenopathy:     Cervical: No cervical adenopathy.  Skin:    Findings: No lesion or rash.  Neurological:     General: No focal deficit present.     Mental Status: She is alert and oriented to person, place, and time.  Psychiatric:        Mood and Affect: Mood normal.        Behavior: Behavior normal.            Assessment & Plan:

## 2022-06-04 ENCOUNTER — Ambulatory Visit: Payer: BC Managed Care – PPO | Admitting: Family Medicine

## 2022-06-04 ENCOUNTER — Encounter: Payer: Self-pay | Admitting: Family Medicine

## 2022-06-04 VITALS — BP 90/70 | HR 106 | Temp 98.2°F | Ht 66.5 in | Wt 139.0 lb

## 2022-06-04 DIAGNOSIS — R300 Dysuria: Secondary | ICD-10-CM

## 2022-06-04 DIAGNOSIS — N3001 Acute cystitis with hematuria: Secondary | ICD-10-CM | POA: Diagnosis not present

## 2022-06-04 LAB — POC URINALSYSI DIPSTICK (AUTOMATED)
Bilirubin, UA: NEGATIVE
Glucose, UA: NEGATIVE
Ketones, UA: NEGATIVE
Leukocytes, UA: NEGATIVE
Nitrite, UA: NEGATIVE
Protein, UA: NEGATIVE
Spec Grav, UA: 1.02 (ref 1.010–1.025)
Urobilinogen, UA: 0.2 E.U./dL
pH, UA: 6 (ref 5.0–8.0)

## 2022-06-04 MED ORDER — SULFAMETHOXAZOLE-TRIMETHOPRIM 800-160 MG PO TABS: 1.0000 | ORAL_TABLET | Freq: Two times a day (BID) | ORAL | 0 refills | Status: DC

## 2022-06-04 NOTE — Progress Notes (Signed)
Becky Marsico T. Becky Devivo, MD, CAQ Sports Medicine [facility] [address] [place], [address]  Phone: [phone]  FAX: [phone]  Becky Mata - 41 y.o. female  MRN [id number]  Date of Birth: Mar 27, 1981  Date: 06/04/2022  PCP: Karie Schwalbe, MD  Referral: Karie Schwalbe, MD  Chief Complaint  Patient presents with   Hematuria   Urinary Urgency   Subjective:   Becky Mata is a 41 y.o. very pleasant female patient with Body mass index is 22.1 kg/m. who presents with the following:  This very pleasant 41 year old presents with dysuria and urgency.  Concerns about a possible UTI.  Last UTI was in January 2023.  Feeling better this morning, but was having a lot of dysuria yesterday.  Felt tired and crampy.  Started her period last weekend.  Had a lot of blood yesterday.   Back hurts a little bit, urinating blood yesterday.  Now with some dysuria.   Felt ok this morning, but very fatigued yesterday. No exterior rash or pain.   Review of Systems is noted in the HPI, as appropriate  Objective:   BP 90/70   Pulse (!) 106   Temp 98.2 F (36.8 C) (Oral)   Ht 5' 6.5" (1.689 m)   Wt 139 lb (63 kg)   LMP 05/28/2022   SpO2 98%   BMI 22.10 kg/m    GEN: WDWN HEENT: Atraumatc, normocephalic. CV: RRR, No M/G/R PULM: CTA B, No wheezes, crackles, or rhonchi ABD: S, NT, ND, +BS, no rebound. No CVAT. No suprapubic tenderness.   Laboratory and Imaging Data: Results for orders placed or performed in visit on 06/04/22  POCT Urinalysis Dipstick (Automated)  Result Value Ref Range   Color, UA Yellow    Clarity, UA Clear    Glucose, UA Negative Negative   Bilirubin, UA Negative    Ketones, UA Negative    Spec Grav, UA 1.020 1.010 - 1.025   Blood, UA Moderate (2+)    pH, UA 6.0 5.0 - 8.0   Protein, UA Negative Negative   Urobilinogen, UA 0.2 0.2 or 1.0 E.U./dL   Nitrite, UA Negative    Leukocytes, UA Negative  Negative     Assessment and Plan:     ICD-10-CM   1. Hematuria due to acute cystitis  N30.01     2. Dysuria  R30.0 POCT Urinalysis Dipstick (Automated)    Urine Culture     Dysuria, fatigue, and urgency in setting of hematuria.  Think that one has to assume that this is a UTI and treat as such.  Medication Management during today's office visit: Meds ordered this encounter  Medications   sulfamethoxazole-trimethoprim (BACTRIM DS) 800-160 MG tablet    Sig: Take 1 tablet by mouth 2 (two) times daily.    Dispense:  14 tablet    Refill:  0   There are no discontinued medications.  Orders placed today for conditions managed today: Orders Placed This Encounter  Procedures   Urine Culture   POCT Urinalysis Dipstick (Automated)    Disposition: No follow-ups on file.  Dragon [employer] One speech-to-text software was used for transcription in this dictation.  Possible transcriptional errors can occur using Animal nutritionist.   Signed,  Elpidio Galea. Ioannis Schuh, MD   Outpatient Encounter Medications as of 06/04/2022  Medication Sig   sulfamethoxazole-trimethoprim (BACTRIM DS) 800-160 MG tablet Take 1 tablet by mouth 2 (two) times daily.   No facility-administered encounter medications  on file as of 06/04/2022.

## 2022-06-05 LAB — URINE CULTURE
MICRO NUMBER:: 13826861
SPECIMEN QUALITY:: ADEQUATE

## 2023-09-01 ENCOUNTER — Encounter: Payer: Self-pay | Admitting: Internal Medicine

## 2023-09-01 ENCOUNTER — Ambulatory Visit: Payer: BC Managed Care – PPO | Admitting: Internal Medicine

## 2023-09-01 VITALS — BP 110/80 | HR 108 | Temp 98.6°F | Ht 66.0 in | Wt 137.0 lb

## 2023-09-01 DIAGNOSIS — K625 Hemorrhage of anus and rectum: Secondary | ICD-10-CM | POA: Diagnosis not present

## 2023-09-01 LAB — COMPREHENSIVE METABOLIC PANEL
ALT: 12 U/L (ref 0–35)
AST: 17 U/L (ref 0–37)
Albumin: 4.3 g/dL (ref 3.5–5.2)
Alkaline Phosphatase: 56 U/L (ref 39–117)
BUN: 14 mg/dL (ref 6–23)
CO2: 30 meq/L (ref 19–32)
Calcium: 9.1 mg/dL (ref 8.4–10.5)
Chloride: 103 meq/L (ref 96–112)
Creatinine, Ser: 0.8 mg/dL (ref 0.40–1.20)
GFR: 89.57 mL/min (ref >60.00)
Glucose, Bld: 131 mg/dL — ABNORMAL HIGH (ref 70–99)
Potassium: 3.9 meq/L (ref 3.5–5.1)
Sodium: 138 meq/L (ref 135–145)
Total Bilirubin: 0.6 mg/dL (ref 0.2–1.2)
Total Protein: 6.8 g/dL (ref 6.0–8.3)

## 2023-09-01 LAB — CBC
HCT: 35.1 % — ABNORMAL LOW (ref 36.0–46.0)
Hemoglobin: 11.6 g/dL — ABNORMAL LOW (ref 12.0–15.0)
MCHC: 33 g/dL (ref 30.0–36.0)
MCV: 85.1 fL (ref 78.0–100.0)
Platelets: 315 10*3/uL (ref 150.0–400.0)
RBC: 4.12 Mil/uL (ref 3.87–5.11)
RDW: 15.6 % — ABNORMAL HIGH (ref 11.5–15.5)
WBC: 7.2 10*3/uL (ref 4.0–10.5)

## 2023-09-01 LAB — SEDIMENTATION RATE: Sed Rate: 14 mm/h (ref 0–20)

## 2023-09-01 NOTE — Assessment & Plan Note (Signed)
Along with rectal urgency, loss of appetite, etc Highly suspicious for colitis----events separated in time, so unlikely infectious Uncle with Crohn's--and this could be the diagnosis  Will check some labs Refer to GI

## 2023-09-01 NOTE — Progress Notes (Signed)
Subjective:    Patient ID: Becky Mata, female    DOB: [birth year], 42 y.o.   MRN: [id number]  HPI Here due to blood in stools  Had some blood in stool almost 2 years ago Happened with some urinary symptoms  Also had another spell--noted slight blood on stool in bowl Also had clot like blood on toilet paper  Now with episode since early November Having rectal urgency--then diarrhea No clear cut abdominal pain--but doesn't feel right Appetite is down--afraid to eat because it gives her pressure, etc Gassy at times Today is the first day she feels some what better----still has pink mucus in BMs (and not much stool)  Bloating at times also No nausea or upper GI symptoms  Current Outpatient Medications on File Prior to Visit  Medication Sig Dispense Refill   Multiple Vitamin (MULTIVITAMIN) capsule Take 1 capsule by mouth daily.     traMADol (ULTRAM) 50 MG tablet Take 50 mg by mouth every 6 (six) hours as needed.     No current facility-administered medications on file prior to visit.    No Known Allergies  Past Medical History:  Diagnosis Date   Anxiety    Bipolar disorder (HCC)    Personality disorder (HCC)     Past Surgical History:  Procedure Laterality Date   CESAREAN SECTION  2012   COLPOSCOPY W/ BIOPSY / CURETTAGE  ~2009    Family History  Problem Relation Age of Onset   Arthritis Mother        rheumatoid arthritis   Alcohol abuse Mother    Hypothyroidism Mother    Anxiety disorder Mother    Depression Mother    Diabetes Mother    Hypertension Father    Bipolar disorder Sister    ADD / ADHD Sister    Drug abuse Sister    Kidney disease Sister    Diabetes Maternal Grandmother    Fibromyalgia Maternal Grandmother    Heart disease Neg Hx     Social History   Socioeconomic History   Marital status: Legally Separated    Spouse name: Not on file   Number of children: 1   Years of education: Not on file   Highest education level: Not on  file  Occupational History   Occupation: Conservation officer, nature    Employer: [employer]  Tobacco Use   Smoking status: Never    Passive exposure: Never   Smokeless tobacco: Never  Substance and Sexual Activity   Alcohol use: No   Drug use: Yes    Types: Marijuana    Comment: last used about 2 weeks ago   Sexual activity: Yes    Birth control/protection: Pill  Other Topics Concern   Not on file  Social History Narrative   Not on file   Social Determinants of Health   Financial Resource Strain: Not on file  Food Insecurity: Not on file  Transportation Needs: Not on file  Physical Activity: Not on file  Stress: Not on file  Social Connections: Not on file  Intimate Partner Violence: Not on file   Review of Systems Felt almost like low grade fever in the past couple of days No apparent tainted food that she knows of Maternal uncle has Crohns No joint swelling or pain No cough or SOB     Objective:   Physical Exam Constitutional:      Appearance: Normal appearance.  Cardiovascular:     Rate and Rhythm: Normal rate and regular rhythm.  Heart sounds: No murmur heard.    No gallop.  Pulmonary:     Effort: Pulmonary effort is normal.     Breath sounds: Normal breath sounds. No wheezing or rales.  Abdominal:     General: There is no distension.     Palpations: Abdomen is soft.     Tenderness: There is no guarding or rebound.     Comments: Mild RLQ tenderness  Genitourinary:    Comments: Normal rectum Vault empty ---heme positive secretions Musculoskeletal:     Cervical back: Neck supple.  Lymphadenopathy:     Cervical: No cervical adenopathy.  Neurological:     Mental Status: She is alert.            Assessment & Plan:

## 2023-10-21 ENCOUNTER — Telehealth: Payer: Self-pay

## 2023-10-21 NOTE — Telephone Encounter (Signed)
 The patient called in and left a voicemail requesting a earlier appointment if available. I called her letting her know we received her message, and I inform the patient there is nothing open for an earlier appointment at this time. She said question about the time frame for the procedure I advised her to ask her nurse.

## 2023-11-15 NOTE — Progress Notes (Signed)
 Ellouise Console, PA-C [address]  [address]  [place], [place] [address]  Main: [phone]  Fax: [phone]   Gastroenterology Consultation  Referring Provider:     Jimmy Charlie FERNS, MD Primary Care Physician:  Jimmy Charlie FERNS, MD Primary Gastroenterologist:  Ellouise Console, PA-C  Reason for Consultation:     Blood in stool         HPI:   Becky Mata is a 43 y.o. y/o female referred for consultation & management  by Jimmy Charlie FERNS, MD.    Patient saw Dr. Jimmy, PCP, 09/01/2023 to evaluate rectal bleeding.  She has had intermittent blood in her stool for almost 2 years.  Since 08/2023 has had rectal urgency and occasional loose stool.  No abdominal pain.  Has increased intestinal gas and pink mucus with BMs.  Has bloating.  Denies nausea, vomiting, or upper abdominal pain.    Pt. States her GI symptoms have improved since January 2025.  She has not had any recent episodes of rectal bleeding or loose stools.  No current abdominal pain.  No previous colonoscopy or GI evaluation.  No family history of Colon Cancer or IBD.  08/2023 labs: CBC showed mild anemia with hemoglobin 11.6, hematocrit 35, MCV 85.  Normal CMP and sed rate.  Past Medical History:  Diagnosis Date   Anxiety    Bipolar disorder (HCC)    Personality disorder [facility])     Past Surgical History:  Procedure Laterality Date   CESAREAN SECTION  2012   COLPOSCOPY W/ BIOPSY / CURETTAGE  ~2009    Prior to Admission medications   Medication Sig Start Date End Date Taking? Authorizing Provider  Multiple Vitamin (MULTIVITAMIN) capsule Take 1 capsule by mouth daily.    [provider]  traMADol  (ULTRAM ) 50 MG tablet Take 50 mg by mouth every 6 (six) hours as needed. 08/19/23   [provider]    Family History  Problem Relation Age of Onset   Arthritis Mother        rheumatoid arthritis   Alcohol abuse Mother    Hypothyroidism Mother    Anxiety disorder Mother    Depression  Mother    Diabetes Mother    Hypertension Father    Bipolar disorder Sister    ADD / ADHD Sister    Drug abuse Sister    Kidney disease Sister    Diabetes Maternal Grandmother    Fibromyalgia Maternal Grandmother    Heart disease Neg Hx      Social History   Tobacco Use   Smoking status: Never    Passive exposure: Never   Smokeless tobacco: Never  Substance Use Topics   Alcohol use: No   Drug use: Yes    Types: Marijuana    Comment: last used about 2 weeks ago    Allergies as of 11/16/2023   (No Known Allergies)    Review of Systems:    All systems reviewed and negative except where noted in HPI.   Physical Exam:  BP 122/85   Pulse 91   Temp 98.1 F (36.7 C)   Ht 5' 6 (1.676 m)   Wt 140 lb 3.2 oz (63.6 kg)   LMP 11/06/2023   BMI 22.63 kg/m  Patient's last menstrual period was 11/06/2023.  General:   Alert,  Well-developed, well-nourished, pleasant and cooperative in NAD Lungs:  Respirations even and unlabored.  Clear throughout to auscultation.   No wheezes, crackles, or rhonchi. No acute distress.  Heart:  Regular rate and rhythm; no murmurs, clicks, rubs, or gallops. Abdomen:  Normal bowel sounds.  No bruits.  Soft, and non-distended without masses, hepatosplenomegaly or hernias noted.  No Tenderness.  No guarding or rebound tenderness.    Neurologic:  Alert and oriented x3;  grossly normal neurologically. Psych:  Alert and cooperative. Normal mood and affect.  Imaging Studies: No results found.  Assessment and Plan:   Becky Mata is a 43 y.o. y/o female has been referred for:  1.  Rectal bleeding  Scheduling Colonoscopy I discussed risks of colonoscopy with patient to include risk of bleeding, colon perforation, and risk of sedation.  Patient expressed understanding and agrees to proceed with colonoscopy.   2.  Mild anemia  Labs: CBC, iron panel, ferritin, B12, folate.  Follow up 2-4 weeks after Colonoscopy with TG.  Ellouise Console,  PA-C

## 2023-11-16 ENCOUNTER — Encounter: Payer: Self-pay | Admitting: Physician Assistant

## 2023-11-16 ENCOUNTER — Ambulatory Visit: Payer: 59 | Admitting: Physician Assistant

## 2023-11-16 VITALS — BP 122/85 | HR 91 | Temp 98.1°F | Ht 66.0 in | Wt 140.2 lb

## 2023-11-16 DIAGNOSIS — K625 Hemorrhage of anus and rectum: Secondary | ICD-10-CM

## 2023-11-16 DIAGNOSIS — D649 Anemia, unspecified: Secondary | ICD-10-CM | POA: Diagnosis not present

## 2023-11-16 DIAGNOSIS — R198 Other specified symptoms and signs involving the digestive system and abdomen: Secondary | ICD-10-CM

## 2023-11-16 MED ORDER — NA SULFATE-K SULFATE-MG SULF 17.5-3.13-1.6 GM/177ML PO SOLN: 1.0000 | Freq: Once | ORAL | 0 refills | Status: AC

## 2023-11-17 ENCOUNTER — Encounter: Payer: Self-pay | Admitting: Physician Assistant

## 2023-11-17 LAB — CBC WITH DIFFERENTIAL/PLATELET
Basophils Absolute: 0.1 10*3/uL (ref 0.0–0.2)
Basos: 1 %
EOS (ABSOLUTE): 0.2 10*3/uL (ref 0.0–0.4)
Eos: 3 %
Hematocrit: 34.1 % (ref 34.0–46.6)
Hemoglobin: 11.3 g/dL (ref 11.1–15.9)
Immature Grans (Abs): 0 10*3/uL (ref 0.0–0.1)
Immature Granulocytes: 0 %
Lymphocytes Absolute: 1.4 10*3/uL (ref 0.7–3.1)
Lymphs: 24 %
MCH: 28.3 pg (ref 26.6–33.0)
MCHC: 33.1 g/dL (ref 31.5–35.7)
MCV: 86 fL (ref 79–97)
Monocytes Absolute: 0.5 10*3/uL (ref 0.1–0.9)
Monocytes: 8 %
Neutrophils Absolute: 3.8 10*3/uL (ref 1.4–7.0)
Neutrophils: 64 %
Platelets: 301 10*3/uL (ref 150–450)
RBC: 3.99 x10E6/uL (ref 3.77–5.28)
RDW: 13.3 % (ref 11.7–15.4)
WBC: 5.9 10*3/uL (ref 3.4–10.8)

## 2023-11-17 LAB — B12 AND FOLATE PANEL
Folate: 15.8 ng/mL (ref >3.0)
Vitamin B-12: 503 pg/mL (ref 232–1245)

## 2023-11-17 LAB — C-REACTIVE PROTEIN: CRP: <1 mg/L (ref 0–10)

## 2023-11-17 LAB — IRON,TIBC AND FERRITIN PANEL
Ferritin: 12 ng/mL — ABNORMAL LOW (ref 15–150)
Iron Saturation: 20 % (ref 15–55)
Iron: 82 ug/dL (ref 27–159)
Total Iron Binding Capacity: 409 ug/dL (ref 250–450)
UIBC: 327 ug/dL (ref 131–425)

## 2023-12-08 ENCOUNTER — Encounter: Payer: Self-pay | Admitting: Gastroenterology

## 2023-12-15 ENCOUNTER — Ambulatory Visit
Admission: RE | Admit: 2023-12-15 | Discharge: 2023-12-15 | Disposition: A | Discharge status: Home / Self Care | Payer: 59 | Source: Ambulatory Visit | Attending: Gastroenterology | Admitting: Gastroenterology

## 2023-12-15 ENCOUNTER — Ambulatory Visit

## 2023-12-15 ENCOUNTER — Encounter
Admission: RE | Disposition: A | Discharge status: Home / Self Care | Payer: Self-pay | Source: Ambulatory Visit | Attending: Gastroenterology

## 2023-12-15 DIAGNOSIS — D649 Anemia, unspecified: Secondary | ICD-10-CM

## 2023-12-15 DIAGNOSIS — K6289 Other specified diseases of anus and rectum: Secondary | ICD-10-CM | POA: Diagnosis not present

## 2023-12-15 DIAGNOSIS — K625 Hemorrhage of anus and rectum: Secondary | ICD-10-CM | POA: Diagnosis present

## 2023-12-15 DIAGNOSIS — F319 Bipolar disorder, unspecified: Secondary | ICD-10-CM | POA: Diagnosis not present

## 2023-12-15 HISTORY — PX: COLONOSCOPY WITH PROPOFOL: SHX5780

## 2023-12-15 LAB — POCT PREGNANCY, URINE: Preg Test, Ur: NEGATIVE

## 2023-12-15 SURGERY — COLONOSCOPY WITH PROPOFOL
Anesthesia: General

## 2023-12-15 MED ORDER — SODIUM CHLORIDE 0.9 % IV SOLN: INTRAVENOUS | Status: DC

## 2023-12-15 MED ORDER — PROPOFOL 500 MG/50ML IV EMUL
INTRAVENOUS | Status: DC | PRN
  Administered 2023-12-15: 200 ug/kg/min via INTRAVENOUS
  Administered 2023-12-15: 100 mg via INTRAVENOUS

## 2023-12-15 NOTE — Transfer of Care (Signed)
 Immediate Anesthesia Transfer of Care Note  Patient: Becky Mata  Procedure(s) Performed: COLONOSCOPY WITH PROPOFOL  Patient Location: PACU  Anesthesia Type:General  Level of Consciousness: awake  Airway & Oxygen Therapy: Patient Spontanous Breathing  Post-op Assessment: Report given to RN and Post -op Vital signs reviewed and stable  Post vital signs: Reviewed and stable  Last Vitals:  Vitals Value Taken Time  BP 75/65 12/15/23 1029  Temp    Pulse 85 12/15/23 1029  Resp 20 12/15/23 1029  SpO2 100 % 12/15/23 1029  Vitals shown include unfiled device data.  Last Pain:  Vitals:   12/15/23 0923  TempSrc: Temporal  PainSc: 8          Complications: There were no known notable events for this encounter.

## 2023-12-15 NOTE — H&P (Signed)
     Wyline Mood, MD [address], [address], [place], [place], [address] [address], [address], [place], [place], [address] Phone: [phone]  Fax: [phone]  Primary Care Physician:  Karie Schwalbe, MD   Pre-Procedure History & Physical: HPI:  Becky Mata is a 43 y.o. female is here for an colonoscopy.   Past Medical History:  Diagnosis Date   Anxiety    Bipolar disorder (HCC)    Personality disorder [facility])     Past Surgical History:  Procedure Laterality Date   CESAREAN SECTION  2012   COLPOSCOPY W/ BIOPSY / CURETTAGE  ~2009    Prior to Admission medications   Medication Sig Start Date End Date Taking? Authorizing Provider  ferrous sulfate 324 MG TBEC Take 324 mg by mouth.   Yes [provider]    Allergies as of 11/16/2023   (No Known Allergies)    Family History  Problem Relation Age of Onset   Arthritis Mother        rheumatoid arthritis   Alcohol abuse Mother    Hypothyroidism Mother    Anxiety disorder Mother    Depression Mother    Diabetes Mother    Hypertension Father    Bipolar disorder Sister    ADD / ADHD Sister    Drug abuse Sister    Kidney disease Sister    Diabetes Maternal Grandmother    Fibromyalgia Maternal Grandmother    Heart disease Neg Hx     Social History   Socioeconomic History   Marital status: Legally Separated    Spouse name: Not on file   Number of children: 1   Years of education: Not on file   Highest education level: Not on file  Occupational History   Occupation: Conservation officer, nature    Employer: [employer]  Tobacco Use   Smoking status: Never    Passive exposure: Never   Smokeless tobacco: Never  Vaping Use   Vaping status: Never Used  Substance and Sexual Activity   Alcohol use: No   Drug use: Not Currently   Sexual activity: Yes    Birth control/protection: Pill  Other Topics Concern   Not on file  Social History Narrative   Not on file   Social Drivers of  Health   Financial Resource Strain: Not on file  Food Insecurity: Not on file  Transportation Needs: Not on file  Physical Activity: Not on file  Stress: Not on file  Social Connections: Not on file  Intimate Partner Violence: Not on file    Review of Systems: See HPI, otherwise negative ROS  Physical Exam: LMP 11/06/2023  General:   Alert,  pleasant and cooperative in NAD Head:  Normocephalic and atraumatic. Neck:  Supple; no masses or thyromegaly. Lungs:  Clear throughout to auscultation, normal respiratory effort.    Heart:  +S1, +S2, Regular rate and rhythm, No edema. Abdomen:  Soft, nontender and nondistended. Normal bowel sounds, without guarding, and without rebound.   Neurologic:  Alert and  oriented x4;  grossly normal neurologically.  Impression/Plan: Becky Mata is here for an colonoscopy to be performed for rectal bleeding Risks, benefits, limitations, and alternatives regarding  colonoscopy have been reviewed with the patient.  Questions have been answered.  All parties agreeable.   Wyline Mood, MD  12/15/2023, 8:30 AM

## 2023-12-15 NOTE — Anesthesia Postprocedure Evaluation (Signed)
 Anesthesia Post Note  Patient: Becky Mata  Procedure(s) Performed: COLONOSCOPY WITH PROPOFOL  Patient location during evaluation: Endoscopy Anesthesia Type: General Level of consciousness: awake and alert Pain management: pain level controlled Vital Signs Assessment: post-procedure vital signs reviewed and stable Respiratory status: spontaneous breathing, nonlabored ventilation, respiratory function stable and patient connected to nasal cannula oxygen Cardiovascular status: blood pressure returned to baseline and stable Postop Assessment: no apparent nausea or vomiting Anesthetic complications: no   There were no known notable events for this encounter.   Last Vitals:  Vitals:   12/15/23 1039 12/15/23 1044  BP: 93/81 110/80  Pulse: 94 87  Resp: 20 (!) 9  Temp:    SpO2: 100% 100%    Last Pain:  Vitals:   12/15/23 1044  TempSrc:   PainSc: 0-No pain                 Cleda Mccreedy Ilian Wessell

## 2023-12-15 NOTE — Anesthesia Preprocedure Evaluation (Signed)
 Anesthesia Evaluation  Patient identified by MRN, date of birth, ID band Patient awake    Reviewed: Allergy & Precautions, NPO status , Patient's Chart, lab work & pertinent test results  History of Anesthesia Complications Negative for: history of anesthetic complications  Airway Mallampati: III  TM Distance: <3 FB Neck ROM: full    Dental  (+) Chipped   Pulmonary neg pulmonary ROS, neg shortness of breath   Pulmonary exam normal        Cardiovascular Exercise Tolerance: Good negative cardio ROS Normal cardiovascular exam     Neuro/Psych  PSYCHIATRIC DISORDERS       Neuromuscular disease    GI/Hepatic negative GI ROS, Neg liver ROS,,,  Endo/Other  negative endocrine ROS    Renal/GU negative Renal ROS  negative genitourinary   Musculoskeletal   Abdominal   Peds  Hematology negative hematology ROS (+)   Anesthesia Other Findings Past Medical History: No date: Anxiety No date: Bipolar disorder (HCC) No date: Personality disorder [facility])  Past Surgical History: 2012: CESAREAN SECTION ~2009: COLPOSCOPY W/ BIOPSY / CURETTAGE  BMI    Body Mass Index: 21.63 kg/m      Reproductive/Obstetrics negative OB ROS                             Anesthesia Physical Anesthesia Plan  ASA: 2  Anesthesia Plan: General   Post-op Pain Management:    Induction: Intravenous  PONV Risk Score and Plan: Propofol infusion and TIVA  Airway Management Planned: Natural Airway and Nasal Cannula  Additional Equipment:   Intra-op Plan:   Post-operative Plan:   Informed Consent: I have reviewed the patients History and Physical, chart, labs and discussed the procedure including the risks, benefits and alternatives for the proposed anesthesia with the patient or authorized representative who has indicated his/her understanding and acceptance.     Dental Advisory Given  Plan Discussed with:  Anesthesiologist, CRNA and Surgeon  Anesthesia Plan Comments: (Patient consented for risks of anesthesia including but not limited to:  - adverse reactions to medications - risk of airway placement if required - damage to eyes, teeth, lips or other oral mucosa - nerve damage due to positioning  - sore throat or hoarseness - Damage to heart, brain, nerves, lungs, other parts of body or loss of life  Patient voiced understanding and assent.)       Anesthesia Quick Evaluation

## 2023-12-15 NOTE — Op Note (Signed)
 [facility] Gastroenterology Patient Name: Becky Mata Procedure Date: 12/15/2023 10:03 AM MRN: [id number] Account #: [id number] Date of Birth: December 26, 1980 Admit Type: Outpatient Age: 43 Room: [facility] ENDO ROOM 3 Gender: Female Note Status: Finalized Instrument Name: Prentice Docker [id number] Procedure:             Colonoscopy Indications:           Rectal bleeding Providers:             Wyline Mood MD, MD Referring MD:          Karie Schwalbe (Referring MD) Medicines:             Monitored Anesthesia Care Complications:         No immediate complications. Procedure:             Pre-Anesthesia Assessment:                        - Prior to the procedure, a History and Physical was                         performed, and patient medications, allergies and                         sensitivities were reviewed. The patient's tolerance                         of previous anesthesia was reviewed.                        - The risks and benefits of the procedure and the                         sedation options and risks were discussed with the                         patient. All questions were answered and informed                         consent was obtained.                        - ASA Grade Assessment: II - A patient with mild                         systemic disease.                        After obtaining informed consent, the colonoscope was                         passed under direct vision. Throughout the procedure,                         the patient's blood pressure, pulse, and oxygen                         saturations were monitored continuously. The                         Colonoscope was introduced through the anus  and                         advanced to the the cecum, identified by appendiceal                         orifice and ileocecal valve. The colonoscopy was                         performed with ease. The patient tolerated the                          procedure well. The quality of the bowel preparation                         was inadequate. Findings:      The perianal and digital rectal examinations were normal.      A large amount of semi-liquid stool was found in the entire colon,       interfering with visualization.      A diffuse area of moderately erythematous mucosa was found in the rectum       and in the recto-sigmoid colon. This was biopsied with a cold forceps       for histology. Impression:            - Preparation of the colon was inadequate.                        - Stool in the entire examined colon.                        - Erythematous mucosa in the rectum and in the                         recto-sigmoid colon. Biopsied. Recommendation:        - Discharge patient to home (with escort).                        - Resume previous diet.                        - Continue present medications.                        - Await pathology results.                        - Repeat colonoscopy in 2 months because the bowel                         preparation was suboptimal. Procedure Code(s):     --- Professional ---                        [phone], Colonoscopy, flexible; with biopsy, single or                         multiple Diagnosis Code(s):     --- Professional ---                        K62.5, Hemorrhage of anus  and rectum                        K62.89, Other specified diseases of anus and rectum                        K63.89, Other specified diseases of intestine CPT copyright 2022 American Medical Association. All rights reserved. The codes documented in this report are preliminary and upon coder review may  be revised to meet current compliance requirements. Wyline Mood, MD Wyline Mood MD, MD 12/15/2023 10:29:20 AM This report has been signed electronically. Number of Addenda: 0 Note Initiated On: 12/15/2023 10:03 AM Scope Withdrawal Time: 0 hours 5 minutes 27 seconds  Total Procedure Duration: 0 hours 8 minutes 32 seconds   Estimated Blood Loss:  Estimated blood loss: none.      [facility]

## 2023-12-16 ENCOUNTER — Encounter: Payer: Self-pay | Admitting: Gastroenterology

## 2023-12-16 LAB — SURGICAL PATHOLOGY

## 2023-12-26 NOTE — Progress Notes (Unsigned)
 Becky Amy, PA-C [address]  [address]  [place], [place] [address]  Main: [phone]  Fax: [phone]   Primary Care Physician: Becky Schwalbe, MD  Primary Gastroenterologist:  Becky Amy, PA-C / Dr. Wyline Mood    CC: F/U Rectal Bleeding, Proctitis  HPI: Becky Mata is a 43 y.o. female returns for 6-week follow-up of blood in stool ongoing for 2 years.  Also has rectal urgency with occasional loose stool alternating with Constipation.  She has seen mucus in her stools.  Rectal bleeding has decreased in the past 2 weeks.  GI symptoms are worse if she eats and better if she does not eat.  She has noticed some epigastric and LUQ pain.  She states her uncle had Crohn's disease.  She denies weight loss.  Not currently on iron.  She is taking a general multivitamin daily.  She attempted colonoscopy recently which showed proctosigmoiditis.  Colon prep was poor with moderate stool, and colonoscopy was incomplete.  Patient states she vomited the liquid prep.  She is requesting to do a pill prep for colonoscopy.  12/15/2023 colonoscopy by Dr. Tobi Bastos: Inadequate colon prep.  Moderate stool in the colon.  Erythematous mucosa in the rectum and rectosigmoid colon.  Biopsies showed moderate chronic active proctitis.  Biopsies negative for microscopic colitis.   82-month repeat colonoscopy was recommended with extra 2-day prep.  11/16/23 labs: Hemoglobin 11.3, normal vitamin B12 and folate. Normal total iron and iron saturation.  Slightly low ferritin.   Normal CRP.  Recommended to take general multivitamin daily.  08/2023 labs: CBC showed hemoglobin 11.6, hematocrit 35, MCV 85.  Normal CMP and sed rate.   Current Outpatient Medications  Medication Sig Dispense Refill   ferrous sulfate 324 MG TBEC Take 324 mg by mouth.     Mesalamine, Sulfite-Free, 4 GM/60ML ENEM Place 1 enema rectally at bedtime. 1800 mL 0   Multiple Vitamin (MULTIVITAMIN) capsule Take 1 capsule by mouth daily.      No current facility-administered medications for this visit.    Allergies as of 12/27/2023   (No Known Allergies)    Past Medical History:  Diagnosis Date   Anxiety    Bipolar disorder (HCC)    Personality disorder [facility])     Past Surgical History:  Procedure Laterality Date   CESAREAN SECTION  2012   COLONOSCOPY WITH PROPOFOL N/A 12/15/2023   Procedure: COLONOSCOPY WITH PROPOFOL;  Surgeon: Wyline Mood, MD;  Location: [facility] ENDOSCOPY;  Service: Gastroenterology;  Laterality: N/A;   COLPOSCOPY W/ BIOPSY / CURETTAGE  ~2009    Review of Systems:    All systems reviewed and negative except where noted in HPI.   Physical Examination:   BP 111/74   Pulse 88   Temp 97.7 F (36.5 C)   Ht 5\' 6"  (1.676 m)   Wt 133 lb 9.6 oz (60.6 kg)   BMI 21.56 kg/m   General: Well-nourished, well-developed in no acute distress.  Lungs: Clear to auscultation bilaterally. Non-labored. Heart: Regular rate and rhythm, no murmurs rubs or gallops.  Abdomen: Bowel sounds are normal; Abdomen is Soft; No hepatosplenomegaly, masses or hernias;  Mild Epigastric and Bilateral lower Abdominal Tenderness; No guarding or rebound tenderness. Neuro: Alert and oriented x 3.  Grossly intact.  Psych: Alert and cooperative, normal mood and affect.   Imaging Studies: No results found.  Assessment and Plan:   Becky Mata is a 43 y.o. y/o female returns for followup of:  1.  Rectal bleeding  Repeat colonoscopy with extra 2-day prep.  Gave SuTab Prep.  She could not tolerate liquid prep.  She will be on Liquid diet for 2 days before Colonoscopy.  2.  Moderate chronic active proctosigmoiditis  [place] mesalamine enema x 30 days, #30, No Refills.  3.  Constipation  Start OTC Miralax 1 capful in a drink once daily.  4.  Epigastric pain  Scheduling EGD I discussed risks of EGD with patient to include risk of bleeding, perforation, and risk of sedation.  Patient expressed understanding and agrees  to proceed with EGD.   5.  Nausea (with Colonoscopy prep)  Rx Zofran 4mg  SL prn, #30, No refill.  6.  Anemia, unspecified - Improved, Stable.  Schedule EGD / Colon.  Becky Amy, PA-C  Follow up in 2 months with MD after EGD / Colon.

## 2023-12-27 ENCOUNTER — Encounter: Payer: Self-pay | Admitting: Physician Assistant

## 2023-12-27 ENCOUNTER — Ambulatory Visit: Payer: 59 | Admitting: Physician Assistant

## 2023-12-27 VITALS — BP 111/74 | HR 88 | Temp 97.7°F | Ht 66.0 in | Wt 133.6 lb

## 2023-12-27 DIAGNOSIS — K625 Hemorrhage of anus and rectum: Secondary | ICD-10-CM

## 2023-12-27 DIAGNOSIS — R110 Nausea: Secondary | ICD-10-CM

## 2023-12-27 DIAGNOSIS — R1013 Epigastric pain: Secondary | ICD-10-CM

## 2023-12-27 DIAGNOSIS — K6289 Other specified diseases of anus and rectum: Secondary | ICD-10-CM

## 2023-12-27 DIAGNOSIS — K5900 Constipation, unspecified: Secondary | ICD-10-CM

## 2023-12-27 DIAGNOSIS — K6389 Other specified diseases of intestine: Secondary | ICD-10-CM

## 2023-12-27 DIAGNOSIS — D649 Anemia, unspecified: Secondary | ICD-10-CM

## 2023-12-27 MED ORDER — MESALAMINE (SULFITE-FREE) 4 GM/60ML RE ENEM: 1.0000 | ENEMA | Freq: Every day | RECTAL | 0 refills | Status: AC

## 2023-12-27 MED ORDER — ONDANSETRON 4 MG PO TBDP: 4.0000 mg | ORAL_TABLET | Freq: Four times a day (QID) | ORAL | 0 refills | Status: AC | PRN

## 2023-12-27 NOTE — Patient Instructions (Signed)
   For constipation: Start OTC Miralax Powder Mix 1 capful in 6 to 8 ounces of a drink once daily  Recommend high-fiber diet, 30 g of fiber daily Eat fruits, vegetables, and whole grains Drink 64 ounces of water / fluids daily.

## 2024-01-05 ENCOUNTER — Telehealth: Payer: Self-pay

## 2024-01-05 NOTE — Telephone Encounter (Signed)
 Spoke with patient today- she does not want to have the EGD -only the colonoscopy-spoke with trish to cancel the EGD .

## 2024-01-16 ENCOUNTER — Other Ambulatory Visit: Payer: Self-pay | Admitting: Physician Assistant

## 2024-01-16 DIAGNOSIS — K625 Hemorrhage of anus and rectum: Secondary | ICD-10-CM

## 2024-01-16 DIAGNOSIS — K6289 Other specified diseases of anus and rectum: Secondary | ICD-10-CM

## 2024-01-19 NOTE — Telephone Encounter (Signed)
 Spoke with patient-scheduled colonoscopy 02/09/24 and she will stop using the Masalmine enemas.

## 2024-02-02 ENCOUNTER — Encounter: Payer: Self-pay | Admitting: Gastroenterology

## 2024-02-03 ENCOUNTER — Telehealth: Payer: Self-pay

## 2024-02-03 ENCOUNTER — Encounter: Payer: Self-pay | Admitting: Gastroenterology

## 2024-02-03 ENCOUNTER — Encounter
Admission: RE | Disposition: A | Discharge status: Home / Self Care | Payer: Self-pay | Source: Home / Self Care | Attending: Gastroenterology

## 2024-02-03 ENCOUNTER — Ambulatory Visit
Admission: RE | Admit: 2024-02-03 | Discharge: 2024-02-03 | Disposition: A | Discharge status: Home / Self Care | Attending: Gastroenterology | Admitting: Gastroenterology

## 2024-02-03 DIAGNOSIS — R1013 Epigastric pain: Secondary | ICD-10-CM | POA: Insufficient documentation

## 2024-02-03 DIAGNOSIS — K625 Hemorrhage of anus and rectum: Secondary | ICD-10-CM | POA: Insufficient documentation

## 2024-02-03 DIAGNOSIS — Z5309 Procedure and treatment not carried out because of other contraindication: Secondary | ICD-10-CM | POA: Diagnosis not present

## 2024-02-03 HISTORY — PX: COLONOSCOPY: SHX5424

## 2024-02-03 SURGERY — COLONOSCOPY
Anesthesia: General

## 2024-02-03 MED ORDER — LIDOCAINE HCL (PF) 2 % IJ SOLN
INTRAMUSCULAR | Status: AC
  Filled 2024-02-03: qty 5

## 2024-02-03 MED ORDER — PROPOFOL 1000 MG/100ML IV EMUL
INTRAVENOUS | Status: AC
  Filled 2024-02-03: qty 100

## 2024-02-03 MED ORDER — SODIUM CHLORIDE 0.9 % IV SOLN
INTRAVENOUS | Status: DC
Start: 2024-02-03 — End: 2024-02-03

## 2024-02-03 NOTE — Telephone Encounter (Signed)
 Per Dr.Anna -patient not cleaned out for Colonoscopy-need to do sutab-provide sample- and miralx prep and Zofran  for nausea if patient feels she needs-  I spoke with patient -she had just left the hospital and stated she would like to call me back later today to schedule repeat colonoscopy.

## 2024-02-03 NOTE — Progress Notes (Signed)
Patient not clean needs to reschedule

## 2024-02-04 ENCOUNTER — Encounter: Payer: Self-pay | Admitting: Gastroenterology

## 2024-02-07 ENCOUNTER — Ambulatory Visit: Admitting: Gastroenterology

## 2024-02-24 ENCOUNTER — Ambulatory Visit: Payer: Self-pay | Admitting: *Deleted

## 2024-02-24 ENCOUNTER — Ambulatory Visit: Admitting: Internal Medicine

## 2024-02-24 ENCOUNTER — Encounter: Payer: Self-pay | Admitting: Internal Medicine

## 2024-02-24 VITALS — BP 132/88 | HR 97 | Temp 98.1°F | Ht 66.0 in | Wt 135.0 lb

## 2024-02-24 DIAGNOSIS — K5120 Ulcerative (chronic) proctitis without complications: Secondary | ICD-10-CM | POA: Diagnosis not present

## 2024-02-24 DIAGNOSIS — T161XXA Foreign body in right ear, initial encounter: Secondary | ICD-10-CM | POA: Insufficient documentation

## 2024-02-24 MED ORDER — LIDOCAINE 5 % EX OINT: 1.0000 | TOPICAL_OINTMENT | CUTANEOUS | 1 refills | Status: AC | PRN

## 2024-02-24 NOTE — Addendum Note (Signed)
 Addended by: Curt Dover I on: 02/24/2024 10:39 AM   Modules accepted: Orders

## 2024-02-24 NOTE — Progress Notes (Signed)
 Subjective:    Patient ID: Becky Mata, female    DOB: 1981/02/14, 43 y.o.   MRN: [id number]  HPI Here due to foreign body (cotton) in ear  Was trying to put cream in right ear---dry and itchy Woman at work gave her some vaseline type stuff that has helped Had it on a cheap Q-tip---and the cotton came off Nurse at school could see it but not get it out  Stressed with bowel issues Took mesalamine  enamas--and seemed to help Then told to stop  Did have proctitis on biopsy with the first one  Had gone for a repeat colonoscopy due to retained stool It got cancelled due to still passing some stuff  Current Outpatient Medications on File Prior to Visit  Medication Sig Dispense Refill   ferrous sulfate 324 MG TBEC Take 324 mg by mouth. (Patient not taking: Reported on 02/24/2024)     Mesalamine , Sulfite-Free, 4 GM/60ML ENEM Place 1 enema rectally at bedtime. 1800 mL 0   Multiple Vitamin (MULTIVITAMIN) capsule Take 1 capsule by mouth daily.     No current facility-administered medications on file prior to visit.    No Known Allergies  Past Medical History:  Diagnosis Date   Anxiety    Bipolar disorder (HCC)    Personality disorder [facility])     Past Surgical History:  Procedure Laterality Date   CESAREAN SECTION  2012   COLONOSCOPY N/A 02/03/2024   Procedure: COLONOSCOPY;  Surgeon: Luke Salaam, MD;  Location: [facility] ENDOSCOPY;  Service: Gastroenterology;  Laterality: N/A;   COLONOSCOPY WITH PROPOFOL  N/A 12/15/2023   Procedure: COLONOSCOPY WITH PROPOFOL ;  Surgeon: Luke Salaam, MD;  Location: [facility] ENDOSCOPY;  Service: Gastroenterology;  Laterality: N/A;   COLPOSCOPY W/ BIOPSY / CURETTAGE  ~2009    Family History  Problem Relation Age of Onset   Arthritis Mother        rheumatoid arthritis   Alcohol abuse Mother    Hypothyroidism Mother    Anxiety disorder Mother    Depression Mother    Diabetes Mother    Hypertension Father    Bipolar disorder Sister    ADD / ADHD  Sister    Drug abuse Sister    Kidney disease Sister    Diabetes Maternal Grandmother    Fibromyalgia Maternal Grandmother    Heart disease Neg Hx     Social History   Socioeconomic History   Marital status: Legally Separated    Spouse name: Not on file   Number of children: 1   Years of education: Not on file   Highest education level: Not on file  Occupational History   Occupation: Conservation officer, nature    Employer: [employer]  Tobacco Use   Smoking status: Never    Passive exposure: Never   Smokeless tobacco: Never  Vaping Use   Vaping status: Never Used  Substance and Sexual Activity   Alcohol use: No   Drug use: Not Currently   Sexual activity: Yes    Birth control/protection: Pill  Other Topics Concern   Not on file  Social History Narrative   Not on file   Social Drivers of Health   Financial Resource Strain: Not on file  Food Insecurity: Not on file  Transportation Needs: Not on file  Physical Activity: Not on file  Stress: Not on file  Social Connections: Not on file  Intimate Partner Violence: Not on file    Review of Systems Getting painful ulcers in mouth  Objective:   Physical Exam Constitutional:      Appearance: Normal appearance.  HENT:     Ears:     Comments: Cotton in right mid canal    Mouth/Throat:     Comments: Some white areas in buccal mucosa--no clear ulceration Neurological:     Mental Status: She is alert.  Psychiatric:     Comments: Very upset            Assessment & Plan:

## 2024-02-24 NOTE — Telephone Encounter (Signed)
 Copied from CRM [id number]. Topic: Clinical - Red Word Triage >> Feb 24, 2024  8:54 AM Alethia Huxley E wrote: [facility] that prompted transfer to Nurse Triage: Patient had an ear ache in right ear and was applying Vaseline with a Q-tip and the cotton part of the Q-tip came off in her ear and now she cannot get it out. Reason for Disposition  [1] Foreign body AND [2] can't remove at home  Answer Assessment - Initial Assessment Questions 1. OBJECT: "What do you think it is?"      I have the cotton tip off a Q Tip stuck in my ear.   I was putting medicine down in my ear and the cotton tip came off.   The school nurse looked in my ear and the cotton is too far down in there for her to remove it.   What should I do? 2. PAIN: "Is it causing any pain?" If Yes, ask: "How bad is the pain?"  (e.g., Scale 1-10; or mild, moderate, severe)   - MILD (1-3): doesn't interfere with normal activities    - MODERATE (4-7): interferes with normal activities or awakens from sleep    - SEVERE (8-10): excruciating pain, unable to do any normal activities      No pain.    3. ONSET: "How long do you think it's been in there?"     This morning 4. DISCHARGE: "Has there been any discharge or bleeding from the ear?" If Yes, ask: "Please describe it". (e.g., clear, cloudy, blood)     No I left my phone at home so I can't get any return calls.   I just need to know what I need to do.     5. OTHER SYMPTOMS: "Do you have any other symptoms?" (e.g., decreased hearing, dizziness)     No 6. PREGNANCY: "Is there any chance you are pregnant?" "When was your last menstrual period?"     Not asked  Protocols used: Ear - Foreign Body-A-AH  Chief Complaint: Cotton tip from a Q Tip is stuck in her right ear and she can't get it out. Symptoms: above   Was putting medicine in her ear and the cotton tip came off in her ear.    Frequency: This morning Pertinent Negatives: Patient denies pain or bleeding Disposition: [] ED /[] Urgent Care (no  appt availability in office) / [x] Appointment(In office/virtual)/ []  [place] [facility]/ [] Home Care/ [] Refused Recommended Disposition /[] [place] [facility]/ []  Follow-up with PCP Additional Notes: Appt made for 9:45 with Dr. Letvak for this morning.

## 2024-02-24 NOTE — Assessment & Plan Note (Signed)
 Cotton plug removed without incident with forceps

## 2024-02-24 NOTE — Assessment & Plan Note (Signed)
 She got some relief from mesalamine  but then told to stop. Colon was negative but biopsy did show proctitis Now getting mouth ulcers and does have FH of Crohn's Will try to get second opinion on this

## 2024-02-24 NOTE — Telephone Encounter (Signed)
I will assess her at the visit 

## 2024-03-07 ENCOUNTER — Encounter: Payer: Self-pay | Admitting: Internal Medicine

## 2024-03-09 ENCOUNTER — Encounter: Payer: Self-pay | Admitting: Internal Medicine

## 2024-03-14 ENCOUNTER — Encounter: Payer: Self-pay | Admitting: Internal Medicine
